# Patient Record
Sex: Female | Born: 1980 | Race: White | Hispanic: No | Marital: Married | Smoking: Never smoker
Health system: Southern US, Community
[De-identification: ages and names within clinical notes are randomized; demographics above are authoritative.]

## PROBLEM LIST (undated history)

## (undated) DIAGNOSIS — G43909 Migraine, unspecified, not intractable, without status migrainosus: Secondary | ICD-10-CM

## (undated) DIAGNOSIS — R011 Cardiac murmur, unspecified: Secondary | ICD-10-CM

## (undated) DIAGNOSIS — K8020 Calculus of gallbladder without cholecystitis without obstruction: Secondary | ICD-10-CM

## (undated) DIAGNOSIS — N200 Calculus of kidney: Secondary | ICD-10-CM

## (undated) DIAGNOSIS — T7840XA Allergy, unspecified, initial encounter: Secondary | ICD-10-CM

## (undated) DIAGNOSIS — J302 Other seasonal allergic rhinitis: Secondary | ICD-10-CM

## (undated) HISTORY — DX: Calculus of gallbladder without cholecystitis without obstruction: K80.20

## (undated) HISTORY — DX: Calculus of kidney: N20.0

## (undated) HISTORY — DX: Allergy, unspecified, initial encounter: T78.40XA

## (undated) HISTORY — PX: CHOLECYSTECTOMY: SHX55

## (undated) HISTORY — DX: Cardiac murmur, unspecified: R01.1

## (undated) HISTORY — DX: Other seasonal allergic rhinitis: J30.2

## (undated) HISTORY — DX: Migraine, unspecified, not intractable, without status migrainosus: G43.909

---

## 2000-11-12 HISTORY — PX: ECTOPIC PREGNANCY SURGERY: SHX613

## 2004-03-15 HISTORY — PX: GALLBLADDER SURGERY: SHX652

## 2004-03-15 HISTORY — PX: CHOLECYSTECTOMY: SHX55

## 2008-07-13 HISTORY — PX: TUBAL LIGATION: SHX77

## 2013-02-25 ENCOUNTER — Ambulatory Visit (INDEPENDENT_AMBULATORY_CARE_PROVIDER_SITE_OTHER): Payer: PRIVATE HEALTH INSURANCE | Admitting: *Deleted

## 2013-02-25 DIAGNOSIS — Z23 Encounter for immunization: Secondary | ICD-10-CM

## 2014-09-10 ENCOUNTER — Encounter: Payer: Self-pay | Admitting: Nurse Practitioner

## 2014-09-10 ENCOUNTER — Ambulatory Visit (INDEPENDENT_AMBULATORY_CARE_PROVIDER_SITE_OTHER): Payer: 59 | Admitting: Nurse Practitioner

## 2014-09-10 ENCOUNTER — Telehealth: Payer: Self-pay | Admitting: Nurse Practitioner

## 2014-09-10 VITALS — BP 94/66 | HR 67 | Temp 98.1°F | Ht 67.5 in | Wt 202.0 lb

## 2014-09-10 DIAGNOSIS — Z6834 Body mass index (BMI) 34.0-34.9, adult: Secondary | ICD-10-CM

## 2014-09-10 DIAGNOSIS — Z6835 Body mass index (BMI) 35.0-35.9, adult: Secondary | ICD-10-CM | POA: Insufficient documentation

## 2014-09-10 DIAGNOSIS — Z114 Encounter for screening for human immunodeficiency virus [HIV]: Secondary | ICD-10-CM

## 2014-09-10 DIAGNOSIS — Z0000 Encounter for general adult medical examination without abnormal findings: Secondary | ICD-10-CM

## 2014-09-10 DIAGNOSIS — R5383 Other fatigue: Secondary | ICD-10-CM | POA: Diagnosis not present

## 2014-09-10 DIAGNOSIS — Z6831 Body mass index (BMI) 31.0-31.9, adult: Secondary | ICD-10-CM

## 2014-09-10 DIAGNOSIS — E559 Vitamin D deficiency, unspecified: Secondary | ICD-10-CM

## 2014-09-10 LAB — LIPID PANEL
Cholesterol: 135 mg/dL (ref 0–200)
HDL: 45.5 mg/dL (ref >39.00)
LDL CALC: 77 mg/dL (ref 0–99)
NONHDL: 89.5
Total CHOL/HDL Ratio: 3
Triglycerides: 63 mg/dL (ref 0.0–149.0)
VLDL: 12.6 mg/dL (ref 0.0–40.0)

## 2014-09-10 LAB — CBC WITH DIFFERENTIAL/PLATELET
BASOS ABS: 0 10*3/uL (ref 0.0–0.1)
Basophils Relative: 0.3 % (ref 0.0–3.0)
EOS PCT: 2.1 % (ref 0.0–5.0)
Eosinophils Absolute: 0.1 10*3/uL (ref 0.0–0.7)
HCT: 42.6 % (ref 36.0–46.0)
Hemoglobin: 14.4 g/dL (ref 12.0–15.0)
LYMPHS PCT: 26.4 % (ref 12.0–46.0)
Lymphs Abs: 1.7 10*3/uL (ref 0.7–4.0)
MCHC: 33.7 g/dL (ref 30.0–36.0)
MCV: 88.1 fl (ref 78.0–100.0)
Monocytes Absolute: 0.5 10*3/uL (ref 0.1–1.0)
Monocytes Relative: 7.4 % (ref 3.0–12.0)
Neutro Abs: 4.2 10*3/uL (ref 1.4–7.7)
Neutrophils Relative %: 63.8 % (ref 43.0–77.0)
Platelets: 242 10*3/uL (ref 150.0–400.0)
RBC: 4.84 Mil/uL (ref 3.87–5.11)
RDW: 13.3 % (ref 11.5–15.5)
WBC: 6.6 10*3/uL (ref 4.0–10.5)

## 2014-09-10 LAB — COMPREHENSIVE METABOLIC PANEL
ALBUMIN: 4.2 g/dL (ref 3.5–5.2)
ALT: 11 U/L (ref 0–35)
AST: 14 U/L (ref 0–37)
Alkaline Phosphatase: 58 U/L (ref 39–117)
BUN: 8 mg/dL (ref 6–23)
CO2: 29 mEq/L (ref 19–32)
CREATININE: 0.59 mg/dL (ref 0.40–1.20)
Calcium: 9.3 mg/dL (ref 8.4–10.5)
Chloride: 104 mEq/L (ref 96–112)
GFR: 122.46 mL/min (ref >60.00)
GLUCOSE: 91 mg/dL (ref 70–99)
POTASSIUM: 4.3 meq/L (ref 3.5–5.1)
Sodium: 136 mEq/L (ref 135–145)
Total Bilirubin: 0.9 mg/dL (ref 0.2–1.2)
Total Protein: 7 g/dL (ref 6.0–8.3)

## 2014-09-10 LAB — VITAMIN D 25 HYDROXY (VIT D DEFICIENCY, FRACTURES): VITD: 12.5 ng/mL — ABNORMAL LOW (ref 30.00–100.00)

## 2014-09-10 LAB — TSH: TSH: 0.5 u[IU]/mL (ref 0.35–4.50)

## 2014-09-10 LAB — T4, FREE: Free T4: 0.83 ng/dL (ref 0.60–1.60)

## 2014-09-10 LAB — VITAMIN B12: Vitamin B-12: >1500 pg/mL — ABNORMAL HIGH (ref 211–911)

## 2014-09-10 LAB — HEMOGLOBIN A1C: Hgb A1c MFr Bld: 5 % (ref 4.6–6.5)

## 2014-09-10 MED ORDER — VITAMIN D3 1.25 MG (50000 UT) PO CAPS: 1.0000 | ORAL_CAPSULE | ORAL | Status: DC

## 2014-09-10 NOTE — Patient Instructions (Addendum)
Our office will call you with lab results and any necessary follow up.  For best health, your body mass index should be about 25.  Weight loss goal is 40  pounds. This will take about 5  months. If you want to count calories, limit to   1800   calories daily if you are exercising 1 to 3 times week &  2100  calories if exercising 5 times weekly or more. Helpful phone app for calorie counting is "Go Meals". Consider reading Eat to Live by Excell Seltzer and begin implementing principles for best human nutrition & health.  Refer to hand out for principles & suggested menu. When you diverge from the guide, get back to healthy choices with the next meal or snack.  As discussed,cut out refined sugar- anything that is sweet when you eat or drink it except fresh fruit.  Cut out refined grains- bread, rolls, biscuits, bagels, muffins, pasta and cereals that have less than 4 grams fiber per serving.  Limit animal fats & proteins to 3 to 4 times/week.  Develop lifelong habits of exercise most days of the week: take a 30 minute walk. The benefits include weight loss, lower risk for heart disease, diabetes, stroke, high blood pressure, lower rates of depression & dementia, better sleep quality & bone health.  Please return in 4 weeks to review diet & exercise changes.   Pleasure to meet you!  Preventive Care for Adults, Female A healthy lifestyle and preventive care can promote health and wellness. Preventive health guidelines for women include the following key practices.  A routine yearly physical is a good way to check with your caregiver about your health and preventive screening. It is a chance to share any concerns and updates on your health, and to receive a thorough exam.  Visit your dentist for a routine exam and preventive care every 6 months. Brush your teeth twice a day and floss once a day. Good oral hygiene prevents tooth decay and gum disease.  The frequency of eye exams is based on your  age, health, family medical history, use of contact lenses, and other factors. Follow your caregiver's recommendations for frequency of eye exams.  Eat a healthy diet. Foods like vegetables, fruits, whole grains, low-fat dairy products, and lean protein foods contain the nutrients you need without too many calories. Decrease your intake of foods high in solid fats, added sugars, and salt. Eat the right amount of calories for you.Get information about a proper diet from your caregiver, if necessary.  Regular physical exercise is one of the most important things you can do for your health. Most adults should get at least 150 minutes of moderate-intensity exercise (any activity that increases your heart rate and causes you to sweat) each week. In addition, most adults need muscle-strengthening exercises on 2 or more days a week.  Maintain a healthy weight. The body mass index (BMI) is a screening tool to identify possible weight problems. It provides an estimate of body fat based on height and weight. Your caregiver can help determine your BMI, and can help you achieve or maintain a healthy weight.For adults 20 years and older:  A BMI below 18.5 is considered underweight.  A BMI of 18.5 to 24.9 is normal.  A BMI of 25 to 29.9 is considered overweight.  A BMI of 30 and above is considered obese.  Maintain normal blood lipids and cholesterol levels by exercising and minimizing your intake of saturated fat. Eat a balanced diet  with plenty of fruit and vegetables. Blood tests for lipids and cholesterol should begin at age 14 and be repeated every 5 years. If your lipid or cholesterol levels are high, you are over 50, or you are at high risk for heart disease, you may need your cholesterol levels checked more frequently.Ongoing high lipid and cholesterol levels should be treated with medicines if diet and exercise are not effective.  If you smoke, find out from your caregiver how to quit. If you do not  use tobacco, do not start.  Lung cancer screening is recommended for adults aged 62 80 years who are at high risk for developing lung cancer because of a history of smoking. Yearly low-dose computed tomography (CT) is recommended for people who have at least a 30-pack-year history of smoking and are a current smoker or have quit within the past 15 years. A pack year of smoking is smoking an average of 1 pack of cigarettes a day for 1 year (for example: 1 pack a day for 30 years or 2 packs a day for 15 years). Yearly screening should continue until the smoker has stopped smoking for at least 15 years. Yearly screening should also be stopped for people who develop a health problem that would prevent them from having lung cancer treatment.  If you are pregnant, do not drink alcohol. If you are breastfeeding, be very cautious about drinking alcohol. If you are not pregnant and choose to drink alcohol, do not exceed 1 drink per day. One drink is considered to be 12 ounces (355 mL) of beer, 5 ounces (148 mL) of wine, or 1.5 ounces (44 mL) of liquor.  Avoid use of street drugs. Do not share needles with anyone. Ask for help if you need support or instructions about stopping the use of drugs.  High blood pressure causes heart disease and increases the risk of stroke. Your blood pressure should be checked at least every 1 to 2 years. Ongoing high blood pressure should be treated with medicines if weight loss and exercise are not effective.  If you are 62 to 36 years old, ask your caregiver if you should take aspirin to prevent strokes.  Diabetes screening involves taking a blood sample to check your fasting blood sugar level. This should be done once every 3 years, after age 39, if you are within normal weight and without risk factors for diabetes. Testing should be considered at a younger age or be carried out more frequently if you are overweight and have at least 1 risk factor for diabetes.  Breast cancer  screening is essential preventive care for women. You should practice "breast self-awareness." This means understanding the normal appearance and feel of your breasts and may include breast self-examination. Any changes detected, no matter how small, should be reported to a caregiver. Women in their 54s and 30s should have a clinical breast exam (CBE) by a caregiver as part of a regular health exam every 1 to 3 years. After age 44, women should have a CBE every year. Starting at age 60, women should consider having a mammography (breast X-ray test) every year. Women who have a family history of breast cancer should talk to their caregiver about genetic screening. Women at a high risk of breast cancer should talk to their caregivers about having magnetic resonance imaging (MRI) and a mammography every year.  Breast cancer gene (BRCA)-related cancer risk assessment is recommended for women who have family members with BRCA-related cancers. BRCA-related cancers include  breast, ovarian, tubal, and peritoneal cancers. Having family members with these cancers may be associated with an increased risk for harmful changes (mutations) in the breast cancer genes BRCA1 and BRCA2. Results of the assessment will determine the need for genetic counseling and BRCA1 and BRCA2 testing.  The Pap test is a screening test for cervical cancer. A Pap test can show cell changes on the cervix that might become cervical cancer if left untreated. A Pap test is a procedure in which cells are obtained and examined from the lower end of the uterus (cervix).  Women should have a Pap test starting at age 64.  Between ages 77 and 72, Pap tests should be repeated every 2 years.  Beginning at age 50, you should have a Pap test every 3 years as long as the past 3 Pap tests have been normal.  Some women have medical problems that increase the chance of getting cervical cancer. Talk to your caregiver about these problems. It is especially  important to talk to your caregiver if a new problem develops soon after your last Pap test. In these cases, your caregiver may recommend more frequent screening and Pap tests.  The above recommendations are the same for women who have or have not gotten the vaccine for human papillomavirus (HPV).  If you had a hysterectomy for a problem that was not cancer or a condition that could lead to cancer, then you no longer need Pap tests. Even if you no longer need a Pap test, a regular exam is a good idea to make sure no other problems are starting.  If you are between ages 59 and 39, and you have had normal Pap tests going back 10 years, you no longer need Pap tests. Even if you no longer need a Pap test, a regular exam is a good idea to make sure no other problems are starting.  If you have had past treatment for cervical cancer or a condition that could lead to cancer, you need Pap tests and screening for cancer for at least 20 years after your treatment.  If Pap tests have been discontinued, risk factors (such as a new sexual partner) need to be reassessed to determine if screening should be resumed.  The HPV test is an additional test that may be used for cervical cancer screening. The HPV test looks for the virus that can cause the cell changes on the cervix. The cells collected during the Pap test can be tested for HPV. The HPV test could be used to screen women aged 7 years and older, and should be used in women of any age who have unclear Pap test results. After the age of 91, women should have HPV testing at the same frequency as a Pap test.  Colorectal cancer can be detected and often prevented. Most routine colorectal cancer screening begins at the age of 95 and continues through age 60. However, your caregiver may recommend screening at an earlier age if you have risk factors for colon cancer. On a yearly basis, your caregiver may provide home test kits to check for hidden blood in the stool.  Use of a small camera at the end of a tube, to directly examine the colon (sigmoidoscopy or colonoscopy), can detect the earliest forms of colorectal cancer. Talk to your caregiver about this at age 64, when routine screening begins. Direct examination of the colon should be repeated every 5 to 10 years through age 36, unless early forms of pre-cancerous  polyps or small growths are found.  Hepatitis C blood testing is recommended for all people born from [birth year] through 1965 and any individual with known risks for hepatitis C.  Practice safe sex. Use condoms and avoid high-risk sexual practices to reduce the spread of sexually transmitted infections (STIs). STIs include gonorrhea, chlamydia, syphilis, trichomonas, herpes, HPV, and human immunodeficiency virus (HIV). Herpes, HIV, and HPV are viral illnesses that have no cure. They can result in disability, cancer, and death. Sexually active women aged 63 and younger should be checked for chlamydia. Older women with new or multiple partners should also be tested for chlamydia. Testing for other STIs is recommended if you are sexually active and at increased risk.  Osteoporosis is a disease in which the bones lose minerals and strength with aging. This can result in serious bone fractures. The risk of osteoporosis can be identified using a bone density scan. Women ages 43 and over and women at risk for fractures or osteoporosis should discuss screening with their caregivers. Ask your caregiver whether you should take a calcium supplement or vitamin D to reduce the rate of osteoporosis.  Menopause can be associated with physical symptoms and risks. Hormone replacement therapy is available to decrease symptoms and risks. You should talk to your caregiver about whether hormone replacement therapy is right for you.  Use sunscreen. Apply sunscreen liberally and repeatedly throughout the day. You should seek shade when your shadow is shorter than you. Protect  yourself by wearing long sleeves, pants, a wide-brimmed hat, and sunglasses year round, whenever you are outdoors.  Once a month, do a whole body skin exam, using a mirror to look at the skin on your back. Notify your caregiver of new moles, moles that have irregular borders, moles that are larger than a pencil eraser, or moles that have changed in shape or color.  Stay current with required immunizations.  Influenza vaccine. All adults should be immunized every year.  Tetanus, diphtheria, and acellular pertussis (Td, Tdap) vaccine. Pregnant women should receive 1 dose of Tdap vaccine during each pregnancy. The dose should be obtained regardless of the length of time since the last dose. Immunization is preferred during the 27th to 36th week of gestation. An adult who has not previously received Tdap or who does not know her vaccine status should receive 1 dose of Tdap. This initial dose should be followed by tetanus and diphtheria toxoids (Td) booster doses every 10 years. Adults with an unknown or incomplete history of completing a 3-dose immunization series with Td-containing vaccines should begin or complete a primary immunization series including a Tdap dose. Adults should receive a Td booster every 10 years.  Varicella vaccine. An adult without evidence of immunity to varicella should receive 2 doses or a second dose if she has previously received 1 dose. Pregnant females who do not have evidence of immunity should receive the first dose after pregnancy. This first dose should be obtained before leaving the health care facility. The second dose should be obtained 4 8 weeks after the first dose.  Human papillomavirus (HPV) vaccine. Females aged 62 26 years who have not received the vaccine previously should obtain the 3-dose series. The vaccine is not recommended for use in pregnant females. However, pregnancy testing is not needed before receiving a dose. If a female is found to be pregnant after  receiving a dose, no treatment is needed. In that case, the remaining doses should be delayed until after the pregnancy. Immunization is recommended  for any person with an immunocompromised condition through the age of 72 years if she did not get any or all doses earlier. During the 3-dose series, the second dose should be obtained 4 8 weeks after the first dose. The third dose should be obtained 24 weeks after the first dose and 16 weeks after the second dose.  Zoster vaccine. One dose is recommended for adults aged 21 years or older unless certain conditions are present.  Measles, mumps, and rubella (MMR) vaccine. Adults born before [birth year] generally are considered immune to measles and mumps. Adults born in [birth year] or later should have 1 or more doses of MMR vaccine unless there is a contraindication to the vaccine or there is laboratory evidence of immunity to each of the three diseases. A routine second dose of MMR vaccine should be obtained at least 28 days after the first dose for students attending postsecondary schools, health care workers, or international travelers. People who received inactivated measles vaccine or an unknown type of measles vaccine during 1963 1967 should receive 2 doses of MMR vaccine. People who received inactivated mumps vaccine or an unknown type of mumps vaccine before 1979 and are at high risk for mumps infection should consider immunization with 2 doses of MMR vaccine. For females of childbearing age, rubella immunity should be determined. If there is no evidence of immunity, females who are not pregnant should be vaccinated. If there is no evidence of immunity, females who are pregnant should delay immunization until after pregnancy. Unvaccinated health care workers born before 3 who lack laboratory evidence of measles, mumps, or rubella immunity or laboratory confirmation of disease should consider measles and mumps immunization with 2 doses of MMR vaccine or rubella  immunization with 1 dose of MMR vaccine.  Pneumococcal 13-valent conjugate (PCV13) vaccine. When indicated, a person who is uncertain of her immunization history and has no record of immunization should receive the PCV13 vaccine. An adult aged 32 years or older who has certain medical conditions and has not been previously immunized should receive 1 dose of PCV13 vaccine. This PCV13 should be followed with a dose of pneumococcal polysaccharide (PPSV23) vaccine. The PPSV23 vaccine dose should be obtained at least 8 weeks after the dose of PCV13 vaccine. An adult aged 22 years or older who has certain medical conditions and previously received 1 or more doses of PPSV23 vaccine should receive 1 dose of PCV13. The PCV13 vaccine dose should be obtained 1 or more years after the last PPSV23 vaccine dose.  Pneumococcal polysaccharide (PPSV23) vaccine. When PCV13 is also indicated, PCV13 should be obtained first. All adults aged 31 years and older should be immunized. An adult younger than age 70 years who has certain medical conditions should be immunized. Any person who resides in a nursing home or long-term care facility should be immunized. An adult smoker should be immunized. People with an immunocompromised condition and certain other conditions should receive both PCV13 and PPSV23 vaccines. People with human immunodeficiency virus (HIV) infection should be immunized as soon as possible after diagnosis. Immunization during chemotherapy or radiation therapy should be avoided. Routine use of PPSV23 vaccine is not recommended for American Indians, [place] Natives, or people younger than 65 years unless there are medical conditions that require PPSV23 vaccine. When indicated, people who have unknown immunization and have no record of immunization should receive PPSV23 vaccine. One-time revaccination 5 years after the first dose of PPSV23 is recommended for people aged 32 64 years who have chronic kidney  failure,  nephrotic syndrome, asplenia, or immunocompromised conditions. People who received 1 2 doses of PPSV23 before age 79 years should receive another dose of PPSV23 vaccine at age 57 years or later if at least 5 years have passed since the previous dose. Doses of PPSV23 are not needed for people immunized with PPSV23 at or after age 51 years.  Meningococcal vaccine. Adults with asplenia or persistent complement component deficiencies should receive 2 doses of quadrivalent meningococcal conjugate (MenACWY-D) vaccine. The doses should be obtained at least 2 months apart. Microbiologists working with certain meningococcal bacteria, [place] recruits, people at risk during an outbreak, and people who travel to or live in countries with a high rate of meningitis should be immunized. A first-year college student up through age 97 years who is living in a residence hall should receive a dose if she did not receive a dose on or after her 16th birthday. Adults who have certain high-risk conditions should receive one or more doses of vaccine.  Hepatitis A vaccine. Adults who wish to be protected from this disease, have certain high-risk conditions, work with hepatitis A-infected animals, work in hepatitis A research labs, or travel to or work in countries with a high rate of hepatitis A should be immunized. Adults who were previously unvaccinated and who anticipate close contact with an international adoptee during the first 60 days after arrival in the [place] from a country with a high rate of hepatitis A should be immunized.  Hepatitis B vaccine. Adults who wish to be protected from this disease, have certain high-risk conditions, may be exposed to blood or other infectious body fluids, are household contacts or sex partners of hepatitis B positive people, are clients or workers in certain care facilities, or travel to or work in countries with a high rate of hepatitis B should be immunized.  Haemophilus  influenzae type b (Hib) vaccine. A previously unvaccinated person with asplenia or sickle cell disease or having a scheduled splenectomy should receive 1 dose of Hib vaccine. Regardless of previous immunization, a recipient of a hematopoietic stem cell transplant should receive a 3-dose series 6 12 months after her successful transplant. Hib vaccine is not recommended for adults with HIV infection. Preventive Services / Frequency Ages 90 to 77  Blood pressure check.** / Every 1 to 2 years.  Lipid and cholesterol check.** / Every 5 years beginning at age 49.  Clinical breast exam.** / Every 3 years for women in their 17s and 41s.  BRCA-related cancer risk assessment.** / For women who have family members with a BRCA-related cancer (breast, ovarian, tubal, or peritoneal cancers).  Pap test.** / Every 2 years from ages 52 through 58. Every 3 years starting at age 60 through age 62 or 76 with a history of 3 consecutive normal Pap tests.  HPV screening.** / Every 3 years from ages 42 through ages 41 to 38 with a history of 3 consecutive normal Pap tests.  Hepatitis C blood test.** / For any individual with known risks for hepatitis C.  Skin self-exam. / Monthly.  Influenza vaccine. / Every year.  Tetanus, diphtheria, and acellular pertussis (Tdap, Td) vaccine.** / Consult your caregiver. Pregnant women should receive 1 dose of Tdap vaccine during each pregnancy. 1 dose of Td every 10 years.  Varicella vaccine.** / Consult your caregiver. Pregnant females who do not have evidence of immunity should receive the first dose after pregnancy.  HPV vaccine. / 3 doses over 6 months, if 19 and younger.  The vaccine is not recommended for use in pregnant females. However, pregnancy testing is not needed before receiving a dose.  Measles, mumps, rubella (MMR) vaccine.** / You need at least 1 dose of MMR if you were born in 1957 or later. You may also need a 2nd dose. For females of childbearing age,  rubella immunity should be determined. If there is no evidence of immunity, females who are not pregnant should be vaccinated. If there is no evidence of immunity, females who are pregnant should delay immunization until after pregnancy.  Pneumococcal 13-valent conjugate (PCV13) vaccine.** / Consult your caregiver.  Pneumococcal polysaccharide (PPSV23) vaccine.** / 1 to 2 doses if you smoke cigarettes or if you have certain conditions.  Meningococcal vaccine.** / 1 dose if you are age 60 to 81 years and a Market researcher living in a residence hall, or have one of several medical conditions, you need to get vaccinated against meningococcal disease. You may also need additional booster doses.  Hepatitis A vaccine.** / Consult your caregiver.  Hepatitis B vaccine.** / Consult your caregiver.  Haemophilus influenzae type b (Hib) vaccine.** / Consult your caregiver. Ages 64 to 56  Blood pressure check.** / Every 1 to 2 years.  Lipid and cholesterol check.** / Every 5 years beginning at age 65.  Lung cancer screening. / Every year if you are aged 34 80 years and have a 30-pack-year history of smoking and currently smoke or have quit within the past 15 years. Yearly screening is stopped once you have quit smoking for at least 15 years or develop a health problem that would prevent you from having lung cancer treatment.  Clinical breast exam.** / Every year after age 49.  BRCA-related cancer risk assessment.** / For women who have family members with a BRCA-related cancer (breast, ovarian, tubal, or peritoneal cancers).  Mammogram.** / Every year beginning at age 65 and continuing for as long as you are in good health. Consult with your caregiver.  Pap test.** / Every 3 years starting at age 18 through age 56 or 91 with a history of 3 consecutive normal Pap tests.  HPV screening.** / Every 3 years from ages 64 through ages 55 to 2 with a history of 3 consecutive normal Pap  tests.  Fecal occult blood test (FOBT) of stool. / Every year beginning at age 54 and continuing until age 47. You may not need to do this test if you get a colonoscopy every 10 years.  Flexible sigmoidoscopy or colonoscopy.** / Every 5 years for a flexible sigmoidoscopy or every 10 years for a colonoscopy beginning at age 59 and continuing until age 30.  Hepatitis C blood test.** / For all people born from 18 through 1967 and any individual with known risks for hepatitis C.  Skin self-exam. / Monthly.  Influenza vaccine. / Every year.  Tetanus, diphtheria, and acellular pertussis (Tdap/Td) vaccine.** / Consult your caregiver. Pregnant women should receive 1 dose of Tdap vaccine during each pregnancy. 1 dose of Td every 10 years.  Varicella vaccine.** / Consult your caregiver. Pregnant females who do not have evidence of immunity should receive the first dose after pregnancy.  Zoster vaccine.** / 1 dose for adults aged 76 years or older.  Measles, mumps, rubella (MMR) vaccine.** / You need at least 1 dose of MMR if you were born in 1957 or later. You may also need a 2nd dose. For females of childbearing age, rubella immunity should be determined. If there is no evidence  of immunity, females who are not pregnant should be vaccinated. If there is no evidence of immunity, females who are pregnant should delay immunization until after pregnancy.  Pneumococcal 13-valent conjugate (PCV13) vaccine.** / Consult your caregiver.  Pneumococcal polysaccharide (PPSV23) vaccine.** / 1 to 2 doses if you smoke cigarettes or if you have certain conditions.  Meningococcal vaccine.** / Consult your caregiver.  Hepatitis A vaccine.** / Consult your caregiver.  Hepatitis B vaccine.** / Consult your caregiver.  Haemophilus influenzae type b (Hib) vaccine.** / Consult your caregiver. Ages 45 and over  Blood pressure check.** / Every 1 to 2 years.  Lipid and cholesterol check.** / Every 5 years  beginning at age 58.  Lung cancer screening. / Every year if you are aged 39 80 years and have a 30-pack-year history of smoking and currently smoke or have quit within the past 15 years. Yearly screening is stopped once you have quit smoking for at least 15 years or develop a health problem that would prevent you from having lung cancer treatment.  Clinical breast exam.** / Every year after age 53.  BRCA-related cancer risk assessment.** / For women who have family members with a BRCA-related cancer (breast, ovarian, tubal, or peritoneal cancers).  Mammogram.** / Every year beginning at age 73 and continuing for as long as you are in good health. Consult with your caregiver.  Pap test.** / Every 3 years starting at age 66 through age 27 or 83 with a 3 consecutive normal Pap tests. Testing can be stopped between 65 and 70 with 3 consecutive normal Pap tests and no abnormal Pap or HPV tests in the past 10 years.  HPV screening.** / Every 3 years from ages 9 through ages 55 or 4 with a history of 3 consecutive normal Pap tests. Testing can be stopped between 65 and 70 with 3 consecutive normal Pap tests and no abnormal Pap or HPV tests in the past 10 years.  Fecal occult blood test (FOBT) of stool. / Every year beginning at age 60 and continuing until age 28. You may not need to do this test if you get a colonoscopy every 10 years.  Flexible sigmoidoscopy or colonoscopy.** / Every 5 years for a flexible sigmoidoscopy or every 10 years for a colonoscopy beginning at age 50 and continuing until age 70.  Hepatitis C blood test.** / For all people born from [birth year] through 1967 and any individual with known risks for hepatitis C.  Osteoporosis screening.** / A one-time screening for women ages 59 and over and women at risk for fractures or osteoporosis.  Skin self-exam. / Monthly.  Influenza vaccine. / Every year.  Tetanus, diphtheria, and acellular pertussis (Tdap/Td) vaccine.** / 1 dose of Td  every 10 years.  Varicella vaccine.** / Consult your caregiver.  Zoster vaccine.** / 1 dose for adults aged 67 years or older.  Pneumococcal 13-valent conjugate (PCV13) vaccine.** / Consult your caregiver.  Pneumococcal polysaccharide (PPSV23) vaccine.** / 1 dose for all adults aged 84 years and older.  Meningococcal vaccine.** / Consult your caregiver.  Hepatitis A vaccine.** / Consult your caregiver.  Hepatitis B vaccine.** / Consult your caregiver.  Haemophilus influenzae type b (Hib) vaccine.** / Consult your caregiver. ** Family history and personal history of risk and conditions may change your caregiver's recommendations. Document Released: 06/27/2001 Document Revised: 08/26/2012 Document Reviewed: 09/26/2010 [facility] Patient Information 2014 [place], [place].

## 2014-09-10 NOTE — Telephone Encounter (Signed)
pls call pt: Advise Labs look great! Vit d is very low-12. Start prescription.  I will see her in 1 mo.

## 2014-09-10 NOTE — Progress Notes (Signed)
Subjective:     Molly Henson is a 34 y.o. female and is here for a comprehensive physical exam. The patient reports problems - seasonal allergies, desires to lose weight, fatigue. Wt loss: weighed 157 6 yrs ago when went through divorce-due to stress. Weighed 175 when graduated HS. Struggles with "sweet cravings"-eats refined sugar & drinks soda everyday. Not exercising. Discussed health risks associated w/obesity. Discussed benefits of lifestyle changes. Fatigue: several mos. No associated symptoms. Started B12 1 mo ago. Taking 3000 mcg qd. No improvement. Likely r/t high sugar diet & no exercise.  History   Social History  . Marital Status: Married    Spouse Name: N/A  . Number of Children: 2  . Years of Education: N/A   Occupational History  . LPN [employer]   Social History Main Topics  . Smoking status: Never Smoker   . Smokeless tobacco: Not on file  . Alcohol Use: No  . Drug Use: No  . Sexual Activity: Yes    Birth Control/ Protection: Surgical   Other Topics Concern  . Not on file   Social History Narrative   Molly Henson is divorced & remarried. She lives with her husband, 2 biological children & step-son.   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   Health Maintenance  Topic Date Due  . INFLUENZA VACCINE  12/14/2014  . PAP SMEAR  06/15/2017  . TETANUS/TDAP  08/10/2018  . HIV Screening  Completed    The following portions of the patient's history were reviewed and updated as appropriate: allergies, current medications, past family history, past medical history, past social history, past surgical history and problem list.  Review of Systems Constitutional: negative for fevers Eyes: positive for contacts/glasses and due for eye exam Ears, nose, mouth, throat, and face: negative for nasal congestion and sore throat Respiratory: negative for cough Cardiovascular: positive for occasional palpitation-lasts few seconds, no associated symptoms.  Occasional LE edema., negative for chest pressure/discomfort Gastrointestinal: positive for constipation, negative for abdominal pain, diarrhea and dyspepsia Genitourinary:negative for abnormal menstrual periods Neurological: positive for occasional migraine, started in HS. Decreased frequency since started wearing galsses Allergic/Immunologic: positive for seasonal allergies-watery eyes, nasal congestion. Not bad enough to take meds.   Objective:    BP 94/66 mmHg  Pulse 67  Temp(Src) 98.1 F (36.7 C) (Oral)  Ht 5' 7.5" (1.715 m)  Wt 202 lb (91.627 kg)  BMI 31.15 kg/m2  SpO2 100%  LMP 09/02/2014 General appearance: alert, cooperative, appears stated age and no distress Head: Normocephalic, without obvious abnormality, atraumatic Eyes: negative findings: lids and lashes normal, conjunctivae and sclerae normal, corneas clear and pupils equal, round, reactive to light and accomodation Ears: normal TM's and external ear canals both ears Throat: lips, mucosa, and tongue normal; teeth and gums normal and missing 1 R upper tooth Neck: no adenopathy, no carotid bruit, supple, symmetrical, trachea midline and thyroid not enlarged, symmetric, no tenderness/mass/nodules Lungs: clear to auscultation bilaterally Heart: regular rate and rhythm, S1, S2 normal, no murmur, click, rub or gallop Abdomen: soft, non-tender; bowel sounds normal; no masses,  no organomegaly Extremities: extremities normal, atraumatic, no cyanosis or edema Pulses: 2+ and symmetric Neurologic: Grossly normal    Assessment:Plan   1. Screening for HIV (human immunodeficiency virus) - HIV antibody  2. Preventative health care [facility] in [place]. rec diet & exercise changes for best health - CBC with Differential/Platelet - Comprehensive metabolic panel - Hemoglobin A1c - Lipid panel -  T4, free - TSH - Vit D  25 hydroxy (rtn osteoporosis monitoring)  3. BMI 31.0-31.9,adult Wt loss goal 40 lb over 5  mos Reviewed specific diet & exercise changes, gave HO   4 fatigue Diet changes & exercise -b12 F/u 4 weeks-wt management, discuss labs

## 2014-09-10 NOTE — Progress Notes (Signed)
Pre visit review using our clinic review tool, if applicable. No additional management support is needed unless otherwise documented below in the visit note. 

## 2014-09-11 LAB — HIV ANTIBODY (ROUTINE TESTING W REFLEX): HIV 1&2 Ab, 4th Generation: NONREACTIVE

## 2014-09-11 NOTE — Telephone Encounter (Signed)
LMOVM-identified about lab results. Patient to call back with any questions or concerns. DPR Signed.  

## 2015-02-03 DIAGNOSIS — R05 Cough: Secondary | ICD-10-CM | POA: Diagnosis not present

## 2015-02-09 ENCOUNTER — Ambulatory Visit (INDEPENDENT_AMBULATORY_CARE_PROVIDER_SITE_OTHER): Payer: 59 | Admitting: Family Medicine

## 2015-02-09 ENCOUNTER — Encounter: Payer: Self-pay | Admitting: Family Medicine

## 2015-02-09 VITALS — BP 110/75 | HR 70 | Temp 97.2°F | Ht 67.5 in | Wt 202.0 lb

## 2015-02-09 DIAGNOSIS — R05 Cough: Secondary | ICD-10-CM

## 2015-02-09 DIAGNOSIS — J189 Pneumonia, unspecified organism: Secondary | ICD-10-CM

## 2015-02-09 DIAGNOSIS — R059 Cough, unspecified: Secondary | ICD-10-CM

## 2015-02-09 MED ORDER — GUAIFENESIN-CODEINE 100-10 MG/5ML PO SYRP: 5.0000 mL | ORAL_SOLUTION | Freq: Three times a day (TID) | ORAL | Status: DC | PRN

## 2015-02-09 MED ORDER — BENZONATATE 100 MG PO CAPS: 100.0000 mg | ORAL_CAPSULE | Freq: Three times a day (TID) | ORAL | Status: DC | PRN

## 2015-02-09 MED ORDER — LEVOFLOXACIN 500 MG PO TABS: 500.0000 mg | ORAL_TABLET | Freq: Every day | ORAL | Status: DC

## 2015-02-09 NOTE — Progress Notes (Signed)
   HPI  Patient presents today for concern for pneumonia  Patient explains that she's been ill for 2 weeks with cough, mild dyspnea, and malaise.  She was started azithromycin 3 days ago that has gotten steadily worse over that time. She states that her symptoms are the most severe today with severe malaise.  PMH: Smoking status noted ROS: Per HPI  Objective: BP 110/75 mmHg  Pulse 70  Temp(Src) 97.2 F (36.2 C) (Oral)  Ht 5' 7.5" (1.715 m)  Wt 202 lb (91.627 kg)  BMI 31.15 kg/m2 Gen: NAD, alert, cooperative with exam HEENT: NCAT,  oropharynx clear, nares clear CV: RRR, good S1/S2, no murmur Resp:  Right lower lung fields with wheezing and some crackles, nonlabored Abd: SNTND, BS present, no guarding or organomegaly Ext: No edema, warm Neuro: Alert and oriented, No gross deficits  CXR 02/09/2015- Streaky infiltrate in RLL  Assessment and plan:  # Community-acquired pneumonia Change azithromycin to Levaquin with worsening symptoms CXR with streaky infiltrates in RLL Also given tussinex for cough and discomfort, limiting use to nighttime given likely sedation Tessalon perles   Orders Placed This Encounter  Procedures  . DG Chest 2 View    Standing Status: Future     Number of Occurrences:      Standing Expiration Date: 04/10/2016    Order Specific Question:  Reason for Exam (SYMPTOM  OR DIAGNOSIS REQUIRED)    Answer:  cough    Order Specific Question:  Is the patient pregnant?    Answer:  No    Order Specific Question:  Preferred imaging location?    Answer:  Internal    Meds ordered this encounter  Medications  . levofloxacin (LEVAQUIN) 500 MG tablet    Sig: Take 1 tablet (500 mg total) by mouth daily.    Dispense:  7 tablet    Refill:  0  . guaiFENesin-codeine (ROBITUSSIN AC) 100-10 MG/5ML syrup    Sig: Take 5 mLs by mouth 3 (three) times daily as needed for cough.    Dispense:  120 mL    Refill:  0  . benzonatate (TESSALON PERLES) 100 MG capsule    Sig:  Take 1 capsule (100 mg total) by mouth 3 (three) times daily as needed for cough.    Dispense:  30 capsule    Refill:  [place], MD [facility] 02/09/2015, 1:07 PM

## 2015-03-04 ENCOUNTER — Other Ambulatory Visit: Payer: Self-pay | Admitting: Nurse Practitioner

## 2015-03-04 ENCOUNTER — Other Ambulatory Visit (INDEPENDENT_AMBULATORY_CARE_PROVIDER_SITE_OTHER): Payer: 59

## 2015-03-04 DIAGNOSIS — J181 Lobar pneumonia, unspecified organism: Principal | ICD-10-CM

## 2015-03-04 DIAGNOSIS — J189 Pneumonia, unspecified organism: Secondary | ICD-10-CM | POA: Diagnosis not present

## 2015-04-06 ENCOUNTER — Other Ambulatory Visit: Payer: Self-pay | Admitting: Nurse Practitioner

## 2015-04-06 ENCOUNTER — Other Ambulatory Visit: Payer: Self-pay | Admitting: *Deleted

## 2015-04-06 ENCOUNTER — Ambulatory Visit (INDEPENDENT_AMBULATORY_CARE_PROVIDER_SITE_OTHER): Payer: 59

## 2015-04-06 DIAGNOSIS — R0989 Other specified symptoms and signs involving the circulatory and respiratory systems: Secondary | ICD-10-CM

## 2015-10-21 ENCOUNTER — Encounter: Payer: Self-pay | Admitting: Nurse Practitioner

## 2015-10-21 ENCOUNTER — Ambulatory Visit (INDEPENDENT_AMBULATORY_CARE_PROVIDER_SITE_OTHER): Payer: 59 | Admitting: Nurse Practitioner

## 2015-10-21 VITALS — BP 95/62 | HR 70 | Temp 97.4°F | Ht 67.5 in | Wt 209.0 lb

## 2015-10-21 DIAGNOSIS — J020 Streptococcal pharyngitis: Secondary | ICD-10-CM | POA: Diagnosis not present

## 2015-10-21 DIAGNOSIS — J029 Acute pharyngitis, unspecified: Secondary | ICD-10-CM | POA: Diagnosis not present

## 2015-10-21 LAB — RAPID STREP SCREEN (MED CTR MEBANE ONLY): Strep Gp A Ag, IA W/Reflex: POSITIVE — AB

## 2015-10-21 MED ORDER — AMOXICILLIN 875 MG PO TABS: 875.0000 mg | ORAL_TABLET | Freq: Two times a day (BID) | ORAL | Status: DC

## 2015-10-21 NOTE — Patient Instructions (Signed)

## 2015-10-21 NOTE — Progress Notes (Signed)
  Subjective:     Molly Henson is a 35 y.o. female who presents for evaluation of sore throat. Associated symptoms include sore throat. Onset of symptoms was 4 days ago, and have been unchanged since that time. She is drinking plenty of fluids. She has not had a recent close exposure to someone with proven streptococcal pharyngitis.  The following portions of the patient's history were reviewed and updated as appropriate: allergies, current medications, past family history, past medical history, past social history, past surgical history and problem list.  Review of Systems Pertinent items are noted in HPI.    Objective:    BP 95/62 mmHg  Pulse 70  Temp(Src) 97.4 F (36.3 C) (Oral)  Ht 5' 7.5" (1.715 m)  Wt 209 lb (94.802 kg)  BMI 32.23 kg/m2 Head: Normocephalic, without obvious abnormality, atraumatic Eyes: conjunctivae/corneas clear. PERRL, EOM's intact. Fundi benign. Ears: normal TM's and external ear canals both ears Nose: Nares normal. Septum midline. Mucosa normal. No drainage or sinus tenderness. Throat: abnormal findings: marked oropharyngeal erythema Neck: no adenopathy, no carotid bruit, no JVD, supple, symmetrical, trachea midline and thyroid not enlarged, symmetric, no tenderness/mass/nodules Lungs: clear to auscultation bilaterally Heart: regular rate and rhythm, S1, S2 normal, no murmur, click, rub or gallop  Laboratory Strep test done. Results:positive.    Assessment:    Acute pharyngitis, likely.    Plan:  Take medication as prescribe Cotton underwear Take shower not bath Cranberry juice, yogurt Force fluids AZO over the counter X2 days Culture pending RTO prn  Mary-Margaret Hassell Done, FNP

## 2015-10-26 ENCOUNTER — Other Ambulatory Visit: Payer: Self-pay | Admitting: *Deleted

## 2015-10-26 MED ORDER — FLUCONAZOLE 150 MG PO TABS: 150.0000 mg | ORAL_TABLET | Freq: Once | ORAL | Status: DC

## 2016-01-25 IMAGING — CR DG CHEST 2V
2 series · 2 of 2 positions shown · non-contrast
Comparison: None.

CLINICAL DATA: Pneumonia

EXAM:
CHEST  2 VIEW

[view not recorded (1 of 2)]
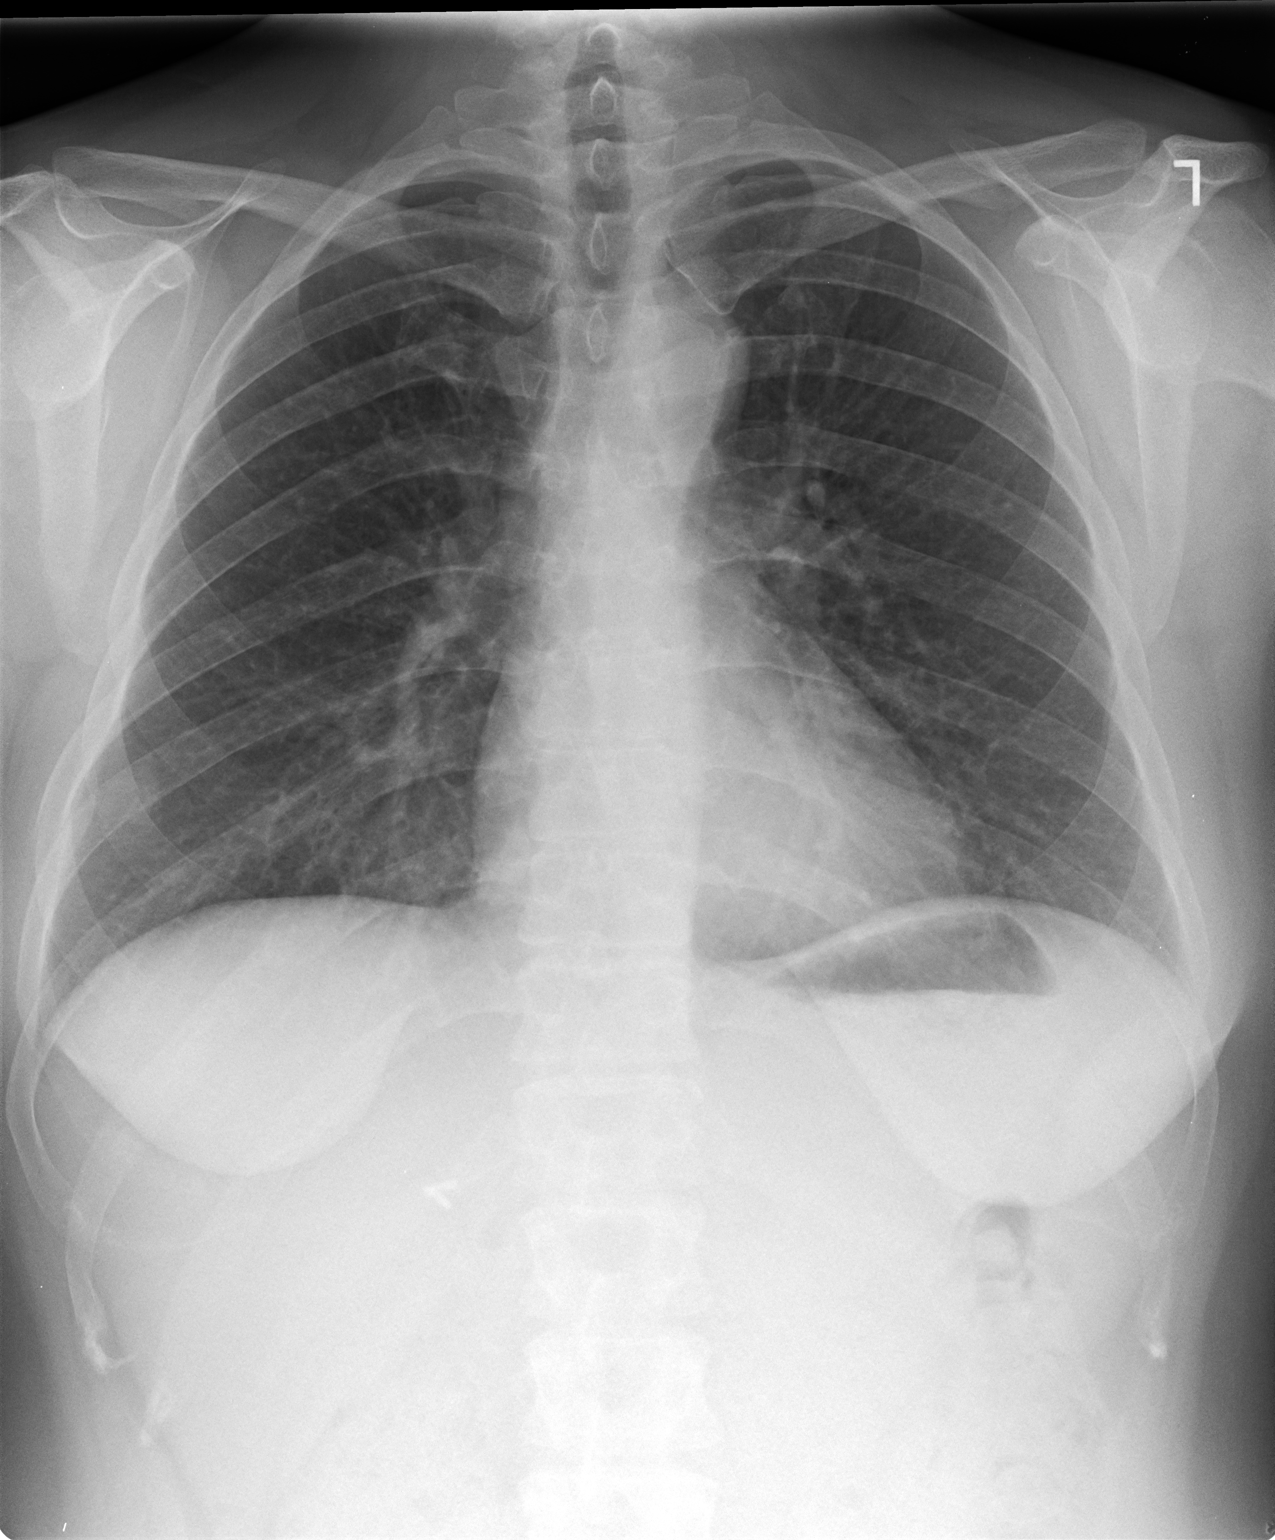

[view not recorded (2 of 2)]
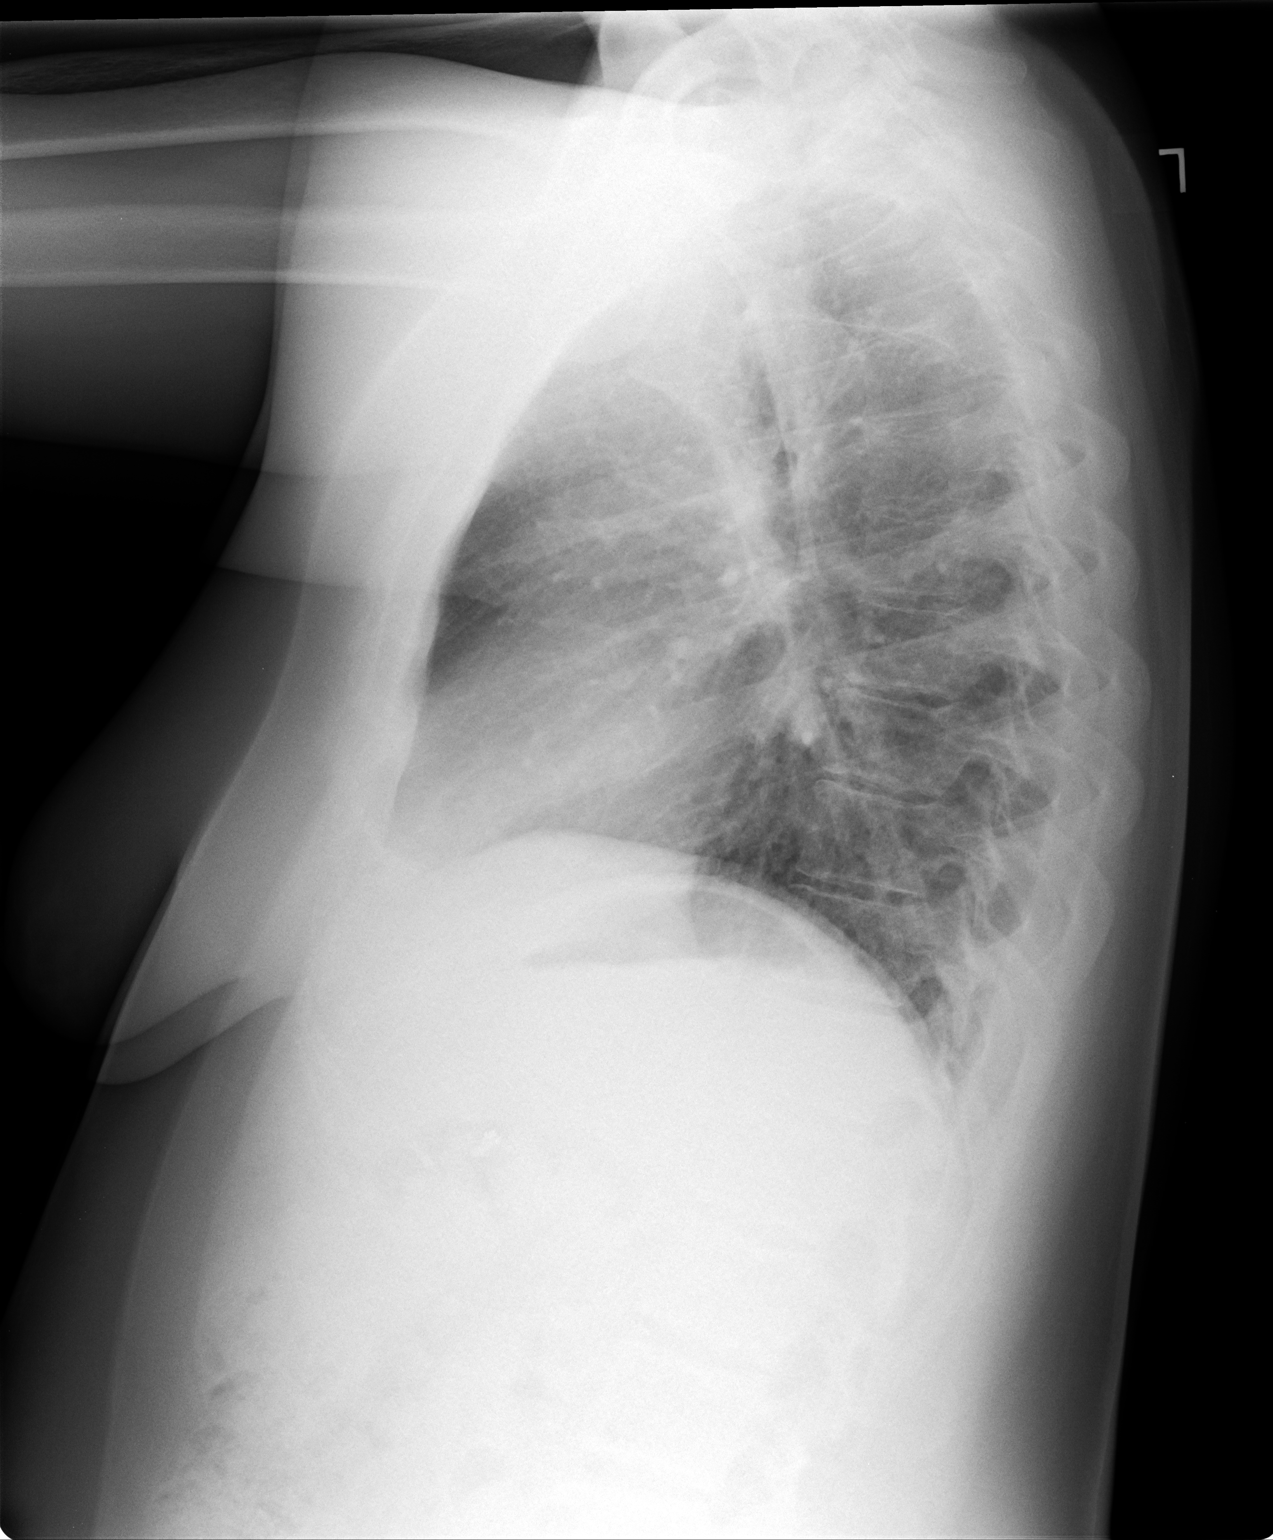

[2 of 2 positions shown; findings below may reference images not displayed]

FINDINGS: Normal heart size. Clear lungs. No pneumothorax or pleural effusion.
IMPRESSION: No active cardiopulmonary disease.

## 2016-03-28 ENCOUNTER — Other Ambulatory Visit: Payer: Self-pay | Admitting: Nurse Practitioner

## 2016-03-28 MED ORDER — NITROFURANTOIN MONOHYD MACRO 100 MG PO CAPS: 100.0000 mg | ORAL_CAPSULE | Freq: Two times a day (BID) | ORAL | 0 refills | Status: DC

## 2016-04-19 DIAGNOSIS — Z01419 Encounter for gynecological examination (general) (routine) without abnormal findings: Secondary | ICD-10-CM | POA: Diagnosis not present

## 2016-04-19 DIAGNOSIS — H5203 Hypermetropia, bilateral: Secondary | ICD-10-CM | POA: Diagnosis not present

## 2016-04-19 DIAGNOSIS — H52223 Regular astigmatism, bilateral: Secondary | ICD-10-CM | POA: Diagnosis not present

## 2016-04-19 DIAGNOSIS — Z6833 Body mass index (BMI) 33.0-33.9, adult: Secondary | ICD-10-CM | POA: Diagnosis not present

## 2016-12-12 ENCOUNTER — Encounter: Payer: Self-pay | Admitting: Family Medicine

## 2016-12-12 ENCOUNTER — Ambulatory Visit (INDEPENDENT_AMBULATORY_CARE_PROVIDER_SITE_OTHER): Payer: 59

## 2016-12-12 ENCOUNTER — Ambulatory Visit (INDEPENDENT_AMBULATORY_CARE_PROVIDER_SITE_OTHER): Payer: 59 | Admitting: Family Medicine

## 2016-12-12 ENCOUNTER — Other Ambulatory Visit: Payer: Self-pay | Admitting: Family Medicine

## 2016-12-12 VITALS — BP 122/71 | HR 75 | Temp 97.6°F | Ht 67.5 in | Wt 217.0 lb

## 2016-12-12 DIAGNOSIS — M545 Low back pain: Secondary | ICD-10-CM

## 2016-12-12 DIAGNOSIS — G8929 Other chronic pain: Secondary | ICD-10-CM

## 2016-12-12 DIAGNOSIS — G43D0 Abdominal migraine, not intractable: Secondary | ICD-10-CM

## 2016-12-12 DIAGNOSIS — E559 Vitamin D deficiency, unspecified: Secondary | ICD-10-CM

## 2016-12-12 DIAGNOSIS — Z0000 Encounter for general adult medical examination without abnormal findings: Secondary | ICD-10-CM

## 2016-12-12 NOTE — Progress Notes (Addendum)
   HPI  Patient presents today here with low back pain and for follow-up chronic medical conditions.  Pt is a nurse in our clinic.   Low back pain Has been bothering her for more than 20 years, started as a teenager without event. Describes pain when she first stands up. Does not limit her activity. She's concerned that if she keeps ignoring it he will likely get worse and cause serious problems. Helped by Aleve or Motrin. No leg weakness. Patient reports long-term paresthesias of the bilateral lateral thigh   Vit d deficiency Previously found to be 12.5, treated with 50,000 units weekly 3 months and not replaced.  Migraine headache Patient had sudden onset left ear ringing, dizziness, and right eye visual disturbance while hiking. That was followed by nausea that has lasted for about 3 days, now improved, and pounding right-sided throbbing type headache. She did have photophobia  No medications were used.   PMH: Smoking status noted ROS: Per HPI  Objective: BP 122/71   Pulse 75   Temp 97.6 F (36.4 C) (Oral)   Ht 5' 7.5" (1.715 m)   Wt 217 lb (98.4 kg)   BMI 33.49 kg/m  Gen: NAD, alert, cooperative with exam HEENT: NCAT CV: RRR, good S1/S2, no murmur Resp: CTABL, no wheezes, non-labored Ext: No edema, warm Neuro: Alert and oriented, No gross deficits MSK:  TTP midline lumbar spine and paraspinal muscles over SI joints.   Assessment and plan:  # Chronic midline low back pain With radiation onto the lateral thighs and the lateral femoral cutaneous nerve distribution, this is L2/L3 distribution. Pain is moderate and worsening, does not limit activity Plain film ordered with tenderness to palpation, physical therapy recommended  Consider lateral femoral cutaneous nerve impingement as it passes under inguinal ligament.   # Preventative health care Labs ordered, will return fasting  # Abdominal migraine Most likely explanation for episode, the episode of visual  disturbance and dizziness lasted only about 15 minutes, throbbing headache resolved slower and the nausea lasted about 3 days NSAIDs recommended  # Vitamin D deficiency Repeat levels, profoundly low previously   Orders Placed This Encounter  Procedures  . DG Lumbar Spine Complete    Standing Status:   Future    Standing Expiration Date:   02/11/2018    Order Specific Question:   Reason for Exam (SYMPTOM  OR DIAGNOSIS REQUIRED)    Answer:   low back pain- chronic, L 2 dist    Order Specific Question:   Is patient pregnant?    Answer:   No    Order Specific Question:   Preferred imaging location?    Answer:   Internal    Order Specific Question:   Radiology Contrast Protocol - do NOT remove file path    Answer:   [email or web address]  . CMP14+EGFR  . CBC with Differential/Platelet  . Lipid panel  . VITAMIN D 25 Hydroxy (Vit-D Deficiency, Fractures)  . TSH  . Ambulatory referral to Physical Therapy    Referral Priority:   Routine    Referral Type:   Physical Medicine    Referral Reason:   Specialty Services Required    Requested Specialty:   Physical Therapy    Number of Visits Requested:   [place], MD [facility] 12/12/2016, 2:54 PM

## 2016-12-28 ENCOUNTER — Ambulatory Visit: Payer: 59 | Attending: Family Medicine | Admitting: Physical Therapy

## 2016-12-28 DIAGNOSIS — M545 Low back pain: Secondary | ICD-10-CM | POA: Diagnosis not present

## 2016-12-28 DIAGNOSIS — G8929 Other chronic pain: Secondary | ICD-10-CM | POA: Insufficient documentation

## 2016-12-28 NOTE — Therapy (Signed)
Molly Henson, [place], [address] Phone: [phone]   Fax:  [phone]  Physical Therapy Evaluation  Patient Details  Name: Molly Henson MRN: [id number] Date of Birth: January 01, 1981 Referring Provider: Kenn File MD.  Encounter Date: 12/28/2016      PT End of Session - 12/28/16 1509    Visit Number 1   Number of Visits 12   Date for PT Re-Evaluation 02/08/17   PT Start Time 0100   PT Stop Time 0149   PT Time Calculation (min) 49 min   Activity Tolerance Patient tolerated treatment well   Behavior During Therapy [facility] for tasks assessed/performed      Past Medical History:  Diagnosis Date  . Allergy   . Heart murmur    as a childd  . Migraines   . Seasonal allergies     Past Surgical History:  Procedure Laterality Date  . CESAREAN SECTION  02/08/2004  . CESAREAN SECTION  07/2008  . CHOLECYSTECTOMY    . ECTOPIC PREGNANCY SURGERY  11/2000  . GALLBLADDER SURGERY  03/2004  . TUBAL LIGATION  07/2008    There were no vitals filed for this visit.       Subjective Assessment - 12/28/16 1426    Subjective The patient has a long h/o low back pain dating back to the early 90's.  She can not recall a specific injury.  Her pain today is a low 2/10 but can rise to much higher levels (7+/10) with increased activites or pressing on a certain area of her spine.  An X-ray revealed some facet degneration.  She also expriences right lateral thigh numbness and occasionally on the right.   Pertinent History Long h/o low back pain.   Patient Stated Goals Get out of pain.   Currently in Pain? Yes   Pain Score 2    Pain Location Back   Pain Descriptors / Indicators Dull   Pain Type Chronic pain   Pain Onset More than a month ago   Pain Frequency Constant   Aggravating Factors  See above.   Pain Relieving Factors Rest.            [facility] PT Assessment - 12/28/16 0001      Assessment   Medical Diagnosis  Chronic mid-line low back pain.   Referring Provider Kenn File MD.   Onset Date/Surgical Date --  [phone].     Precautions   Precautions None     Restrictions   Weight Bearing Restrictions No     Balance Screen   Has the patient fallen in the past 6 months No   Has the patient had a decrease in activity level because of a fear of falling?  No   Is the patient reluctant to leave their home because of a fear of falling?  No     Home Ecologist residence     Prior Function   Level of Independence Independent     Posture/Postural Control   Posture/Postural Control Postural limitations     ROM / Strength   AROM / PROM / Strength AROM;Strength     AROM   Overall AROM Comments Lumbar extension to 20 degrees with decreases pain and flexion decreased by 50%.  Bilateral SLR's= 45 degrees.     Strength   Overall Strength Comments Normal bilateral LE strength.     Palpation   Palpation comment Tender with overpressure at L5-S1.  Special Tests    Special Tests Lumbar;Leg LengthTest  Normal LE DTR's.   Lumbar Tests --  (-)SLR testing. (-) FABER testing.   Leg length test  --  Equal leg lengths.            Objective measurements completed on examination: See above findings.          [facility] Adult PT Treatment/Exercise - 12/28/16 0001      Modalities   Modalities Electrical Stimulation;Moist Hotel manager Location Lower lumbar.   Electrical Stimulation Action IFC    Electrical Stimulation Parameters 80-150 Hz x 20 minutes.   Electrical Stimulation Goals Pain                     PT Long Term Goals - 12/28/16 1513      PT LONG TERM GOAL #1   Title Independent with a HEP.   Time 6   Period Weeks   Status New     PT LONG TERM GOAL #2   Title Perform ADL's with pain not > 2/10.   Time 6   Period Weeks   Status New     PT LONG TERM GOAL #3   Title Eliminate  thigh numbness.   Time 6   Period Weeks   Status New                Plan - 12/28/16 1509    Clinical Impression Statement The patient presents to [facility] with chronic midline low back pain.  She is very tender to palpation with overpressure at L5-S1.  Her back feels better with extension.  She has limited spinal range of motion into flexion.  Her pain can rise to high levels at times for no apparent reason.  Patient will benefit from skilled physical therapy to address deficits and pain.   History and Personal Factors relevant to plan of care: Lonf h/o low back pain.   Clinical Presentation Stable   Clinical Decision Making Low   Rehab Potential Good   PT Frequency 2x / week   PT Duration 6 weeks   PT Treatment/Interventions ADLs/Self Care Home Management;Cryotherapy;Electrical Stimulation;Moist Heat;Ultrasound;Traction;Therapeutic exercise;Therapeutic activities;Functional mobility training;Patient/family education;Manual techniques;Dry needling   Consulted and Agree with Plan of Care Patient      Patient will benefit from skilled therapeutic intervention in order to improve the following deficits and impairments:  Decreased activity tolerance, Decreased range of motion  Visit Diagnosis: Chronic midline low back pain, with sciatica presence unspecified - Plan: PT plan of care cert/re-cert     Problem List Patient Active Problem List   Diagnosis Date Noted  . BMI 31.0-31.9,adult 09/10/2014  . Preventative health care 09/10/2014  . Other fatigue 09/10/2014    [name], [place] MPT 12/28/2016, 3:17 PM  [facility] [address] [place], [place], [address] Phone: [phone]   Fax:  [phone]  Name: Molly Henson MRN: [id number] Date of Birth: July 11, 1980

## 2017-01-01 ENCOUNTER — Encounter: Payer: Self-pay | Admitting: Physical Therapy

## 2017-01-01 ENCOUNTER — Ambulatory Visit: Payer: 59 | Admitting: Physical Therapy

## 2017-01-01 DIAGNOSIS — M545 Low back pain: Principal | ICD-10-CM

## 2017-01-01 DIAGNOSIS — G8929 Other chronic pain: Secondary | ICD-10-CM | POA: Diagnosis not present

## 2017-01-01 NOTE — Patient Instructions (Addendum)
Pelvic Tilt    Flatten back by tightening stomach muscles and buttocks. Don't hold your breath. Repeat __10__ times per set. Do __2__ sets per session. Do __2-3__ sessions per day. Hold for 5 seconds.  http://orth.exer.us/134   Copyright  VHI. All rights reserved.

## 2017-01-01 NOTE — Therapy (Addendum)
[facility] [place], [place], [address] Phone: [phone]   Fax:  [phone]  Physical Therapy Treatment  Patient Details  Name: ARNECIA ECTOR MRN: [id number] Date of Birth: May 04, 1981 Referring Provider: Kenn File MD.  Encounter Date: 01/01/2017      PT End of Session - 01/01/17 1303    Visit Number 2   Number of Visits 12   Date for PT Re-Evaluation 02/08/17   PT Start Time 1302   PT Stop Time 1327   PT Time Calculation (min) 25 min   Activity Tolerance Patient tolerated treatment well   Behavior During Therapy [facility] for tasks assessed/performed      Past Medical History:  Diagnosis Date  . Allergy   . Heart murmur    as a childd  . Migraines   . Seasonal allergies     Past Surgical History:  Procedure Laterality Date  . CESAREAN SECTION  02/08/2004  . CESAREAN SECTION  07/2008  . CHOLECYSTECTOMY    . ECTOPIC PREGNANCY SURGERY  11/2000  . GALLBLADDER SURGERY  03/2004  . TUBAL LIGATION  07/2008    There were no vitals filed for this visit.      Subjective Assessment - 01/01/17 1302    Subjective Reports that so far today her back is okay. Reports that thigh numbness is constant and has been present since the 90s. Pain can start upon waking at times and no real aggravating factor has been found.   Pertinent History Long h/o low back pain.   Patient Stated Goals Get out of pain.   Currently in Pain? No/denies   Aggravating Factors  None determined by patient   Pain Relieving Factors Rest ("usually")            [facility] PT Assessment - 01/01/17 0001      Assessment   Medical Diagnosis Chronic mid-line low back pain.     Precautions   Precautions None     Restrictions   Weight Bearing Restrictions No                     [facility] Adult PT Treatment/Exercise - 01/01/17 0001      Exercises   Exercises Lumbar     Lumbar Exercises: Aerobic   Stationary Bike NuStep L5 x10 min  with core activation     Lumbar Exercises: Supine   Ab Set 20 reps;5 seconds   Bent Knee Raise 20 reps;4 seconds   Bent Knee Raise Limitations with core activation   Bridge 10 reps;4 seconds  began feeling uncomfortable sensation     Lumbar Exercises: Prone   Other Prone Lumbar Exercises POE x2 min, POE with L roadkill x1 min   Other Prone Lumbar Exercises Prone pressups x10 reps 3 sec hold                PT Education - 01/01/17 1338    Education provided Yes   Education Details Ab set   Person(s) Educated Patient   Methods Explanation;Handout;Verbal cues   Comprehension Verbalized understanding;Verbal cues required             PT Long Term Goals - 12/28/16 1513      PT LONG TERM GOAL #1   Title Independent with a HEP.   Time 6   Period Weeks   Status New     PT LONG TERM GOAL #2   Title Perform ADL's with pain not > 2/10.   Time  6   Period Weeks   Status New     PT LONG TERM GOAL #3   Title Eliminate thigh numbness.   Time 6   Period Weeks   Status New               Plan - 01/01/17 1331    Clinical Impression Statement Patient presented in clinic with no current LBP today but continued numbness along L hip which she states she has noticed moving into R low back as well. Patient educated regarding purpose of core activation and guided through initial core/lumbar strengthening exercises with uncomfortable sensation noted by patient following several reps of briding. Patient was also guided through prone exercises with pain reported by patient with prone on elbows with L roadkill. Patient provided HEP for core activation to gradually increase strength and to focus on basic core strength first.    Rehab Potential Good   PT Frequency 2x / week   PT Duration 6 weeks   PT Treatment/Interventions ADLs/Self Care Home Management;Cryotherapy;Electrical Stimulation;Moist Heat;Ultrasound;Traction;Therapeutic exercise;Therapeutic activities;Functional  mobility training;Patient/family education;Manual techniques;Dry needling   PT Next Visit Plan Mckenzie lumbar exercises in standing and prone; supine HSS; hip bridges; abd strengthening.  Modalites and STW/M.  Can try int lumbar traction at 40% body weight.   PT Home Exercise Plan HEP- ab set   Consulted and Agree with Plan of Care Patient      Patient will benefit from skilled therapeutic intervention in order to improve the following deficits and impairments:  Decreased activity tolerance, Decreased range of motion  Visit Diagnosis: Chronic midline low back pain, with sciatica presence unspecified     Problem List Patient Active Problem List   Diagnosis Date Noted  . BMI 31.0-31.9,adult 09/10/2014  . Preventative health care 09/10/2014  . Other fatigue 09/10/2014    Wynelle Fanny, PTA 01/01/2017, 1:39 PM  [facility] [address] [place], [place], [address] Phone: [phone]   Fax:  [phone]  Name: ALLYNA PITTSLEY MRN: [id number] Date of Birth: Jun 27, 1980 PHYSICAL THERAPY DISCHARGE SUMMARY  Visits from Start of Care: 2.  Current functional level related to goals / functional outcomes: See above.   Remaining deficits: See below.   Education / Equipment: HEP. Plan: Patient agrees to discharge.  Patient goals were not met. Patient is being discharged due to not returning since the last visit.  ?????         Mali Applegate MPT

## 2017-01-08 ENCOUNTER — Encounter: Payer: 59 | Admitting: Physical Therapy

## 2017-06-05 ENCOUNTER — Other Ambulatory Visit: Payer: Self-pay | Admitting: *Deleted

## 2017-06-05 MED ORDER — FLUCONAZOLE 150 MG PO TABS: ORAL_TABLET | ORAL | 0 refills | Status: DC

## 2017-06-12 ENCOUNTER — Encounter: Payer: Self-pay | Admitting: Nurse Practitioner

## 2017-06-12 ENCOUNTER — Ambulatory Visit (INDEPENDENT_AMBULATORY_CARE_PROVIDER_SITE_OTHER): Payer: 59 | Admitting: Nurse Practitioner

## 2017-06-12 VITALS — BP 115/61 | HR 75 | Temp 98.6°F | Ht 67.0 in | Wt 223.0 lb

## 2017-06-12 DIAGNOSIS — Z6834 Body mass index (BMI) 34.0-34.9, adult: Secondary | ICD-10-CM | POA: Diagnosis not present

## 2017-06-12 DIAGNOSIS — R635 Abnormal weight gain: Secondary | ICD-10-CM

## 2017-06-12 MED ORDER — PHENTERMINE HCL 37.5 MG PO TABS: 37.5000 mg | ORAL_TABLET | Freq: Every day | ORAL | 3 refills | Status: DC

## 2017-06-12 NOTE — Progress Notes (Signed)
   Subjective:    Patient ID: Molly Henson, female    DOB: Oct 01, 1980, 37 y.o.   MRN: [id number]  HPI Patient comes in today c/o weight gain. She has gained about 25lbs in the last 3 months. 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    Review of Systems  Constitutional: Negative.  Negative for activity change and appetite change.  HENT: Negative.   Eyes: Negative for pain.  Respiratory: Negative for shortness of breath.   Cardiovascular: Negative for chest pain, palpitations and leg swelling.  Gastrointestinal: Negative for abdominal pain.  Endocrine: Negative for polydipsia.  Genitourinary: Negative.   Skin: Negative for rash.  Neurological: Negative for dizziness, weakness and headaches.  Hematological: Does not bruise/bleed easily.  Psychiatric/Behavioral: Negative.   All other systems reviewed and are negative.      Objective:   Physical Exam  Constitutional: She is oriented to person, place, and time. She appears well-developed and well-nourished. No distress.  Cardiovascular: Normal rate and regular rhythm.  Pulmonary/Chest: Effort normal and breath sounds normal.  Neurological: She is alert and oriented to person, place, and time.  Skin: Skin is warm.  Psychiatric: She has a normal mood and affect. Her behavior is normal. Judgment and thought content normal.   BP 115/61   Pulse 75   Temp 98.6 F (37 C) (Oral)   Ht 5\' 7"  (1.702 m)   Wt 223 lb (101.2 kg)   BMI 34.93 kg/m       Assessment & Plan:  1. BMI 34.0-34.9,adult Increase protein in diet and decrease carbs Meds ordered this encounter  Medications  . phentermine (ADIPEX-P) 37.5 MG tablet    Sig: Take 1 tablet (37.5 mg total) by mouth daily before breakfast.    Dispense:  30 tablet    Refill:  3    Order Specific Question:   Supervising Provider    Answer:   VINCENT, CAROL L [[id number]]   RTO in 2 months follow up  [place], FNP

## 2017-06-12 NOTE — Patient Instructions (Signed)
Exercising to Lose Weight Exercising can help you to lose weight. In order to lose weight through exercise, you need to do vigorous-intensity exercise. You can tell that you are exercising with vigorous intensity if you are breathing very hard and fast and cannot hold a conversation while exercising. Moderate-intensity exercise helps to maintain your current weight. You can tell that you are exercising at a moderate level if you have a higher heart rate and faster breathing, but you are still able to hold a conversation. How often should I exercise? Choose an activity that you enjoy and set realistic goals. Your health care provider can help you to make an activity plan that works for you. Exercise regularly as directed by your health care provider. This may include:  Doing resistance training twice each week, such as: ? Push-ups. ? Sit-ups. ? Lifting weights. ? Using resistance bands.  Doing a given intensity of exercise for a given amount of time. Choose from these options: ? 150 minutes of moderate-intensity exercise every week. ? 75 minutes of vigorous-intensity exercise every week. ? A mix of moderate-intensity and vigorous-intensity exercise every week.  Children, pregnant women, people who are out of shape, people who are overweight, and older adults may need to consult a health care provider for individual recommendations. If you have any sort of medical condition, be sure to consult your health care provider before starting a new exercise program. What are some activities that can help me to lose weight?  Walking at a rate of at least 4.5 miles an hour.  Jogging or running at a rate of 5 miles per hour.  Biking at a rate of at least 10 miles per hour.  Lap swimming.  Roller-skating or in-line skating.  Cross-country skiing.  Vigorous competitive sports, such as football, basketball, and soccer.  Jumping rope.  Aerobic dancing. How can I be more active in my day-to-day  activities?  Use the stairs instead of the elevator.  Take a walk during your lunch break.  If you drive, park your car farther away from work or school.  If you take public transportation, get off one stop early and walk the rest of the way.  Make all of your phone calls while standing up and walking around.  Get up, stretch, and walk around every 30 minutes throughout the day. What guidelines should I follow while exercising?  Do not exercise so much that you hurt yourself, feel dizzy, or get very short of breath.  Consult your health care provider prior to starting a new exercise program.  Wear comfortable clothes and shoes with good support.  Drink plenty of water while you exercise to prevent dehydration or heat stroke. Body water is lost during exercise and must be replaced.  Work out until you breathe faster and your heart beats faster. This information is not intended to replace advice given to you by your health care provider. Make sure you discuss any questions you have with your health care provider. Document Released: 06/03/2010 Document Revised: 10/07/2015 Document Reviewed: 10/02/2013 Elsevier Interactive Patient Education  2018 Elsevier Inc[employer]  

## 2017-06-18 DIAGNOSIS — H52223 Regular astigmatism, bilateral: Secondary | ICD-10-CM | POA: Diagnosis not present

## 2017-06-18 DIAGNOSIS — H5203 Hypermetropia, bilateral: Secondary | ICD-10-CM | POA: Diagnosis not present

## 2018-01-08 ENCOUNTER — Other Ambulatory Visit: Payer: 59

## 2018-01-08 ENCOUNTER — Other Ambulatory Visit: Payer: Self-pay | Admitting: Nurse Practitioner

## 2018-01-08 DIAGNOSIS — R5383 Other fatigue: Secondary | ICD-10-CM

## 2018-01-08 MED ORDER — DOXYCYCLINE HYCLATE 100 MG PO TABS: 100.0000 mg | ORAL_TABLET | Freq: Two times a day (BID) | ORAL | 0 refills | Status: DC

## 2018-01-08 NOTE — Progress Notes (Signed)
doxy 

## 2018-01-10 LAB — CBC WITH DIFFERENTIAL/PLATELET
BASOS: 1 %
Basophils Absolute: 0 10*3/uL (ref 0.0–0.2)
EOS (ABSOLUTE): 0.2 10*3/uL (ref 0.0–0.4)
EOS: 3 %
HEMATOCRIT: 41.8 % (ref 34.0–46.6)
Hemoglobin: 13.8 g/dL (ref 11.1–15.9)
IMMATURE GRANS (ABS): 0 10*3/uL (ref 0.0–0.1)
Immature Granulocytes: 0 %
LYMPHS: 29 %
Lymphocytes Absolute: 2.1 10*3/uL (ref 0.7–3.1)
MCH: 29.5 pg (ref 26.6–33.0)
MCHC: 33 g/dL (ref 31.5–35.7)
MCV: 89 fL (ref 79–97)
MONOS ABS: 0.6 10*3/uL (ref 0.1–0.9)
Monocytes: 9 %
NEUTROS ABS: 4.1 10*3/uL (ref 1.4–7.0)
NEUTROS PCT: 58 %
Platelets: 269 10*3/uL (ref 150–450)
RBC: 4.68 x10E6/uL (ref 3.77–5.28)
RDW: 12.5 % (ref 12.3–15.4)
WBC: 7.1 10*3/uL (ref 3.4–10.8)

## 2018-01-10 LAB — LYME AB/WESTERN BLOT REFLEX: Lyme IgG/IgM Ab: <0.91 {ISR} (ref 0.00–0.90)

## 2018-01-10 LAB — ROCKY MTN SPOTTED FVR ABS PNL(IGG+IGM)
RMSF IgG: NEGATIVE
RMSF IgM: 0.43 index (ref 0.00–0.89)

## 2018-01-10 LAB — VITAMIN D 25 HYDROXY (VIT D DEFICIENCY, FRACTURES): Vit D, 25-Hydroxy: 27.9 ng/mL — ABNORMAL LOW (ref 30.0–100.0)

## 2018-12-27 ENCOUNTER — Other Ambulatory Visit: Payer: Self-pay

## 2018-12-27 ENCOUNTER — Encounter: Payer: Self-pay | Admitting: Family Medicine

## 2018-12-27 ENCOUNTER — Ambulatory Visit (INDEPENDENT_AMBULATORY_CARE_PROVIDER_SITE_OTHER): Payer: No Typology Code available for payment source | Admitting: Family Medicine

## 2018-12-27 VITALS — BP 118/71 | HR 75 | Temp 97.8°F | Ht 67.0 in | Wt 229.0 lb

## 2018-12-27 DIAGNOSIS — Z1239 Encounter for other screening for malignant neoplasm of breast: Secondary | ICD-10-CM

## 2018-12-27 DIAGNOSIS — Z131 Encounter for screening for diabetes mellitus: Secondary | ICD-10-CM

## 2018-12-27 DIAGNOSIS — R5383 Other fatigue: Secondary | ICD-10-CM

## 2018-12-27 DIAGNOSIS — Z6835 Body mass index (BMI) 35.0-35.9, adult: Secondary | ICD-10-CM

## 2018-12-27 DIAGNOSIS — K625 Hemorrhage of anus and rectum: Secondary | ICD-10-CM | POA: Insufficient documentation

## 2018-12-27 DIAGNOSIS — Z1329 Encounter for screening for other suspected endocrine disorder: Secondary | ICD-10-CM

## 2018-12-27 DIAGNOSIS — Z13220 Encounter for screening for lipoid disorders: Secondary | ICD-10-CM

## 2018-12-27 DIAGNOSIS — Z1211 Encounter for screening for malignant neoplasm of colon: Secondary | ICD-10-CM

## 2018-12-27 DIAGNOSIS — Z0000 Encounter for general adult medical examination without abnormal findings: Secondary | ICD-10-CM | POA: Diagnosis not present

## 2018-12-27 DIAGNOSIS — K644 Residual hemorrhoidal skin tags: Secondary | ICD-10-CM

## 2018-12-27 MED ORDER — HYDROCORTISONE ACETATE 25 MG RE SUPP: 25.0000 mg | Freq: Two times a day (BID) | RECTAL | 0 refills | Status: DC

## 2018-12-27 NOTE — Patient Instructions (Signed)
Health Maintenance, Female Adopting a healthy lifestyle and getting preventive care are important in promoting health and wellness. Ask your health care provider about:  The right schedule for you to have regular tests and exams.  Things you can do on your own to prevent diseases and keep yourself healthy. What should I know about diet, weight, and exercise? Eat a healthy diet   Eat a diet that includes plenty of vegetables, fruits, low-fat dairy products, and lean protein.  Do not eat a lot of foods that are high in solid fats, added sugars, or sodium. Maintain a healthy weight Body mass index (BMI) is used to identify weight problems. It estimates body fat based on height and weight. Your health care provider can help determine your BMI and help you achieve or maintain a healthy weight. Get regular exercise Get regular exercise. This is one of the most important things you can do for your health. Most adults should:  Exercise for at least 150 minutes each week. The exercise should increase your heart rate and make you sweat (moderate-intensity exercise).  Do strengthening exercises at least twice a week. This is in addition to the moderate-intensity exercise.  Spend less time sitting. Even light physical activity can be beneficial. Watch cholesterol and blood lipids Have your blood tested for lipids and cholesterol at 40 years of age, then have this test every 5 years. Have your cholesterol levels checked more often if:  Your lipid or cholesterol levels are high.  You are older than 40 years of age.  You are at high risk for heart disease. What should I know about cancer screening? Depending on your health history and family history, you may need to have cancer screening at various ages. This may include screening for:  Breast cancer.  Cervical cancer.  Colorectal cancer.  Skin cancer.  Lung cancer. What should I know about heart disease, diabetes, and high blood  pressure? Blood pressure and heart disease  High blood pressure causes heart disease and increases the risk of stroke. This is more likely to develop in people who have high blood pressure readings, are of African descent, or are overweight.  Have your blood pressure checked: ? Every 3-5 years if you are 18-39 years of age. ? Every year if you are 40 years old or older. Diabetes Have regular diabetes screenings. This checks your fasting blood sugar level. Have the screening done:  Once every three years after age 40 if you are at a normal weight and have a low risk for diabetes.  More often and at a younger age if you are overweight or have a high risk for diabetes. What should I know about preventing infection? Hepatitis B If you have a higher risk for hepatitis B, you should be screened for this virus. Talk with your health care provider to find out if you are at risk for hepatitis B infection. Hepatitis C Testing is recommended for:  Everyone born from 1945 through 1965.  Anyone with known risk factors for hepatitis C. Sexually transmitted infections (STIs)  Get screened for STIs, including gonorrhea and chlamydia, if: ? You are sexually active and are younger than 40 years of age. ? You are older than 40 years of age and your health care provider tells you that you are at risk for this type of infection. ? Your sexual activity has changed since you were last screened, and you are at increased risk for chlamydia or gonorrhea. Ask your health care provider if   you are at risk.  Ask your health care provider about whether you are at high risk for HIV. Your health care provider may recommend a prescription medicine to help prevent HIV infection. If you choose to take medicine to prevent HIV, you should first get tested for HIV. You should then be tested every 3 months for as long as you are taking the medicine. Pregnancy  If you are about to stop having your period (premenopausal) and  you may become pregnant, seek counseling before you get pregnant.  Take 400 to 800 micrograms (mcg) of folic acid every day if you become pregnant.  Ask for birth control (contraception) if you want to prevent pregnancy. Osteoporosis and menopause Osteoporosis is a disease in which the bones lose minerals and strength with aging. This can result in bone fractures. If you are 65 years old or older, or if you are at risk for osteoporosis and fractures, ask your health care provider if you should:  Be screened for bone loss.  Take a calcium or vitamin D supplement to lower your risk of fractures.  Be given hormone replacement therapy (HRT) to treat symptoms of menopause. Follow these instructions at home: Lifestyle  Do not use any products that contain nicotine or tobacco, such as cigarettes, e-cigarettes, and chewing tobacco. If you need help quitting, ask your health care provider.  Do not use street drugs.  Do not share needles.  Ask your health care provider for help if you need support or information about quitting drugs. Alcohol use  Do not drink alcohol if: ? Your health care provider tells you not to drink. ? You are pregnant, may be pregnant, or are planning to become pregnant.  If you drink alcohol: ? Limit how much you use to 0-1 drink a day. ? Limit intake if you are breastfeeding.  Be aware of how much alcohol is in your drink. In the [place] one drink equals one 12 oz bottle of beer (355 mL), one 5 oz glass of wine (148 mL), or one 1 oz glass of hard liquor (44 mL). General instructions  Schedule regular health, dental, and eye exams.  Stay current with your vaccines.  Tell your health care provider if: ? You often feel depressed. ? You have ever been abused or do not feel safe at home. Summary  Adopting a healthy lifestyle and getting preventive care are important in promoting health and wellness.  Follow your health care provider's instructions about healthy  diet, exercising, and getting tested or screened for diseases.  Follow your health care provider's instructions on monitoring your cholesterol and blood pressure. This information is not intended to replace advice given to you by your health care provider. Make sure you discuss any questions you have with your health care provider. Document Released: 11/14/2010 Document Revised: 04/24/2018 Document Reviewed: 04/24/2018 Elsevier Patient Education  2020 [employer] Inc.  

## 2018-12-27 NOTE — Progress Notes (Signed)
Subjective:  Patient ID: Molly Henson, female    DOB: [birth year], 38 y.o.   MRN: [id number]  Patient Care Team: Baruch Gouty, FNP as PCP - General (Family Medicine)   Chief Complaint:  Annual Exam   HPI: Molly Henson is a 38 y.o. female presenting on 12/27/2018 for Annual Exam   Pt presents today for her annual physical exam. Pt states Molly Henson has been doing fairly well overall. States Molly Henson does have fatigue on a daily basis and has noticed an increase in weight. Molly Henson states Molly Henson does sleep pretty well but still feels tired. Molly Henson has a history of thyroid disease in her family. Molly Henson has never been diagnosed with thyroid problems. Pt states Molly Henson has diarrhea at times. States this has been ongoing since her cholecystectomy. States Molly Henson does have hemorrhoids and has noted some bright red blood in her stools at times. No dizziness, palpitations, chest pain, weakness, or syncope. Pt states Molly Henson has not tried anything for the hemorrhoids and has not been evaluated by GI. No history of CRC in the family. 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 Molly Henson does not watch her diet or exercise on a regular basis. Molly Henson is due to a mammogram as Molly Henson has recently turned 5. No history of breast cancer in first degree relative.  Has PAPs completed at OB/GYN in [place], [place].     Relevant past medical, surgical, family, and social history reviewed and updated as indicated.  Allergies and medications reviewed and updated. Date reviewed: Chart in Epic.   Past Medical History:  Diagnosis Date  . Allergy   . Heart murmur    as a childd  . Migraines   . Seasonal allergies     Past Surgical History:  Procedure Laterality Date  . CESAREAN SECTION  02/08/2004  . CESAREAN SECTION  07/2008  . CHOLECYSTECTOMY    . ECTOPIC PREGNANCY SURGERY  11/2000  . GALLBLADDER SURGERY  03/2004  . TUBAL LIGATION  07/2008    Social History   Socioeconomic History  . Marital status: Married    Spouse name:  Not on file  . Number of children: 2  . Years of education: Not on file  . Highest education level: Not on file  Occupational History  . Occupation: Economist: [employer]  . Financial resource strain: Not on file  . Food insecurity    Worry: Not on file    Inability: Not on file  . Transportation needs    Medical: Not on file    Non-medical: Not on file  Tobacco Use  . Smoking status: Never Smoker  . Smokeless tobacco: Never Used  Substance and Sexual Activity  . Alcohol use: No    Alcohol/week: 0.0 standard drinks  . Drug use: No  . Sexual activity: Yes    Birth control/protection: Surgical  Lifestyle  . Physical activity    Days per week: Not on file    Minutes per session: Not on file  . Stress: Not on file  Relationships  . Social Herbalist on phone: Not on file    Gets together: Not on file    Attends religious service: Not on file    Active member of club or organization: Not on file    Attends meetings of clubs or organizations: Not on file    Relationship status: Not on file  . Intimate partner  violence    Fear of current or ex partner: Not on file    Emotionally abused: Not on file    Physically abused: Not on file    Forced sexual activity: Not on file  Other Topics Concern  . Not on file  Social History Narrative   Molly Henson is divorced & remarried. Molly Henson lives with her husband, 2 biological children & step-son.   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    Outpatient Encounter Medications as of 12/27/2018  Medication Sig  . hydrocortisone (ANUSOL-HC) 25 MG suppository Place 1 suppository (25 mg total) rectally 2 (two) times daily.  . [DISCONTINUED] doxycycline (VIBRA-TABS) 100 MG tablet Take 1 tablet (100 mg total) by mouth 2 (two) times daily. 1 po bid  . [DISCONTINUED] phentermine (ADIPEX-P) 37.5 MG tablet Take 1 tablet (37.5 mg total) by mouth daily before breakfast.   No facility-administered  encounter medications on file as of 12/27/2018.     No Known Allergies  Review of Systems  Constitutional: Positive for fatigue and unexpected weight change. Negative for activity change, appetite change, chills, diaphoresis and fever.  HENT: Negative.   Eyes: Negative.  Negative for photophobia and visual disturbance.  Respiratory: Negative for cough, chest tightness, shortness of breath, wheezing and stridor.   Cardiovascular: Negative for chest pain, palpitations and leg swelling.  Gastrointestinal: Positive for blood in stool. Negative for abdominal distention, abdominal pain, anal bleeding, constipation, diarrhea, nausea, rectal pain and vomiting.  Endocrine: Negative.  Negative for cold intolerance, heat intolerance, polydipsia, polyphagia and polyuria.  Genitourinary: Negative for decreased urine volume, difficulty urinating, dysuria, frequency, menstrual problem and urgency.  Musculoskeletal: Positive for back pain (intermittent, ongoing for years). Negative for arthralgias, gait problem, joint swelling, myalgias, neck pain and neck stiffness.  Skin: Negative.  Negative for color change, pallor, rash and wound.  Allergic/Immunologic: Negative.   Neurological: Negative for dizziness, tremors, seizures, syncope, facial asymmetry, speech difficulty, weakness, light-headedness, numbness and headaches.  Hematological: Negative.  Negative for adenopathy. Does not bruise/bleed easily.  Psychiatric/Behavioral: Negative for agitation, behavioral problems, confusion, decreased concentration, dysphoric mood, hallucinations, self-injury, sleep disturbance and suicidal ideas. The patient is not nervous/anxious and is not hyperactive.   All other systems reviewed and are negative.       Objective:  BP 118/71   Pulse 75   Temp 97.8 F (36.6 C) (Oral)   Ht 5' 7" (1.702 m)   Wt 229 lb (103.9 kg)   BMI 35.87 kg/m    Wt Readings from Last 3 Encounters:  12/27/18 229 lb (103.9 kg)  06/12/17  223 lb (101.2 kg)  12/12/16 217 lb (98.4 kg)    Physical Exam Vitals signs and nursing note reviewed.  Constitutional:      General: Molly Henson is not in acute distress.    Appearance: Normal appearance. Molly Henson is well-developed and well-groomed. Molly Henson is obese. Molly Henson is not ill-appearing, toxic-appearing or diaphoretic.  HENT:     Head: Normocephalic and atraumatic.     Jaw: There is normal jaw occlusion.     Right Ear: Hearing, tympanic membrane, ear canal and external ear normal.     Left Ear: Hearing, tympanic membrane, ear canal and external ear normal.     Nose: Nose normal.     Mouth/Throat:     Lips: Pink.     Mouth: Mucous membranes are moist.     Pharynx: Oropharynx is clear. Uvula midline.  Eyes:     General: Lids are normal.  Extraocular Movements: Extraocular movements intact.     Conjunctiva/sclera: Conjunctivae normal.     Pupils: Pupils are equal, round, and reactive to light.  Neck:     Musculoskeletal: Normal range of motion and neck supple.     Thyroid: No thyroid mass, thyromegaly or thyroid tenderness.     Vascular: No carotid bruit or JVD.     Trachea: Trachea and phonation normal.  Cardiovascular:     Rate and Rhythm: Normal rate and regular rhythm.     Chest Wall: PMI is not displaced.     Pulses: Normal pulses.     Heart sounds: Normal heart sounds. No murmur. No friction rub. No gallop.   Pulmonary:     Effort: Pulmonary effort is normal. No respiratory distress.     Breath sounds: Normal breath sounds. No wheezing.  Chest:     Breasts:        Right: Normal.        Left: Normal.  Abdominal:     General: Abdomen is protuberant. Bowel sounds are normal. There is no distension or abdominal bruit.     Palpations: Abdomen is soft. There is no hepatomegaly or splenomegaly.     Tenderness: There is no abdominal tenderness. There is no right CVA tenderness or left CVA tenderness.     Hernia: No hernia is present.  Genitourinary:    Comments: Deferred, pt sees  OB/GYN for PAPs Musculoskeletal: Normal range of motion.     Right lower leg: No edema.     Left lower leg: No edema.  Lymphadenopathy:     Cervical: No cervical adenopathy.     Upper Body:     Right upper body: No supraclavicular or axillary adenopathy.     Left upper body: No supraclavicular or axillary adenopathy.  Skin:    General: Skin is warm and dry.     Capillary Refill: Capillary refill takes less than 2 seconds.     Coloration: Skin is not cyanotic, jaundiced or pale.     Findings: No rash.       Neurological:     General: No focal deficit present.     Mental Status: Molly Henson is alert and oriented to person, place, and time.     Cranial Nerves: Cranial nerves are intact.     Sensory: Sensation is intact.     Motor: Motor function is intact.     Coordination: Coordination is intact.     Gait: Gait is intact.     Deep Tendon Reflexes: Reflexes are normal and symmetric.  Psychiatric:        Attention and Perception: Attention and perception normal.        Mood and Affect: Mood and affect normal.        Speech: Speech normal.        Behavior: Behavior normal. Behavior is cooperative.        Thought Content: Thought content normal.        Cognition and Memory: Cognition and memory normal.        Judgment: Judgment normal.     Results for orders placed or performed in visit on 01/08/18  Lyme Ab/Western Blot Reflex  Result Value Ref Range   Lyme IgG/IgM Ab <0.91 0.00 - 0.90 ISR   LYME DISEASE AB, QUANT, IGM <0.80 0.00 - 0.79 index  Rocky mtn spotted fvr abs pnl(IgG+IgM)  Result Value Ref Range   RMSF IgG Negative Negative   RMSF IgM 0.43 0.00 - 0.89 index  CBC with Differential/Platelet  Result Value Ref Range   WBC 7.1 3.4 - 10.8 x10E3/uL   RBC 4.68 3.77 - 5.28 x10E6/uL   Hemoglobin 13.8 11.1 - 15.9 g/dL   Hematocrit 41.8 34.0 - 46.6 %   MCV 89 79 - 97 fL   MCH 29.5 26.6 - 33.0 pg   MCHC 33.0 31.5 - 35.7 g/dL   RDW 12.5 12.3 - 15.4 %   Platelets 269 150 - 450  x10E3/uL   Neutrophils 58 Not Estab. %   Lymphs 29 Not Estab. %   Monocytes 9 Not Estab. %   Eos 3 Not Estab. %   Basos 1 Not Estab. %   Neutrophils Absolute 4.1 1.4 - 7.0 x10E3/uL   Lymphocytes Absolute 2.1 0.7 - 3.1 x10E3/uL   Monocytes Absolute 0.6 0.1 - 0.9 x10E3/uL   EOS (ABSOLUTE) 0.2 0.0 - 0.4 x10E3/uL   Basophils Absolute 0.0 0.0 - 0.2 x10E3/uL   Immature Granulocytes 0 Not Estab. %   Immature Grans (Abs) 0.0 0.0 - 0.1 x10E3/uL  VITAMIN D 25 Hydroxy (Vit-D Deficiency, Fractures)  Result Value Ref Range   Vit D, 25-Hydroxy 27.9 (L) 30.0 - 100.0 ng/mL       Pertinent labs & imaging results that were available during my care of the patient were reviewed by me and considered in my medical decision making.  Assessment & Plan:  Molly Henson was seen today for annual exam.  Diagnoses and all orders for this visit:  Annual physical exam Health maintenance discussed. Will update Tdap soon. PAPs at OB/GYN office. Diet and exercise encouraged. Labs pending. -     Thyroid Panel With TSH -     Lipid panel -     CMP14+EGFR -     CBC with Differential/Platelet -     MM 3D SCREEN BREAST BILATERAL; Future -     Cologuard  BMI 35.0-35.9,adult Diet and exercise encouraged. Discussed possible medical management, declined at this time, wants to change diet and start exercising. Will monitor progress.  -     Thyroid Panel With TSH -     Lipid panel -     CMP14+EGFR -     CBC with Differential/Platelet  Other fatigue Sleep hygiene discussed. Will check below.  -     Thyroid Panel With TSH -     CMP14+EGFR -     CBC with Differential/Platelet  Rectal bleeding Hemorrhoids, external Intermittent, will refer to GI for further evaluation. Anusol as needed for hemorrhoids. Report any new or worsening symptoms.  -     Ambulatory referral to Gastroenterology -     hydrocortisone (ANUSOL-HC) 25 MG suppository; Place 1 suppository (25 mg total) rectally 2 (two) times daily.  Colon cancer  screening Due to report of intermittent rectal bleeding, pt would like to have cologuard testing. Discussed risks and benefits. Pt wishes to proceed. Test ordered.  -     Cologuard  Screening for lipoid disorders -     Lipid panel  Screening for malignant neoplasm of breast -     MM 3D SCREEN BREAST BILATERAL; Future  Screening for thyroid disorder -     Thyroid Panel With TSH  Screening for diabetes mellitus -     CMP14+EGFR     Continue all other maintenance medications.  Follow up plan: Return in 1 year (on 12/27/2019), or if symptoms worsen or fail to improve.  Continue healthy lifestyle choices, including diet (rich in fruits, vegetables, and lean proteins, and low  in salt and simple carbohydrates) and exercise (at least 30 minutes of moderate physical activity daily).  Educational handout given for health maintenance   The above assessment and management plan was discussed with the patient. The patient verbalized understanding of and has agreed to the management plan. Patient is aware to call the clinic if symptoms persist or worsen. Patient is aware when to return to the clinic for a follow-up visit. Patient educated on when it is appropriate to go to the emergency department.   Monia Pouch, FNP-C [facility] [phone] 12/28/18

## 2019-02-10 ENCOUNTER — Other Ambulatory Visit: Payer: Self-pay | Admitting: *Deleted

## 2019-02-10 MED ORDER — FLUCONAZOLE 150 MG PO TABS: 150.0000 mg | ORAL_TABLET | Freq: Once | ORAL | 0 refills | Status: AC

## 2019-03-04 ENCOUNTER — Other Ambulatory Visit: Payer: No Typology Code available for payment source

## 2019-03-04 ENCOUNTER — Other Ambulatory Visit: Payer: Self-pay

## 2019-03-04 DIAGNOSIS — R300 Dysuria: Secondary | ICD-10-CM

## 2019-03-04 LAB — URINALYSIS, COMPLETE
Bilirubin, UA: NEGATIVE
Glucose, UA: NEGATIVE
Ketones, UA: NEGATIVE
Leukocytes,UA: NEGATIVE
Nitrite, UA: NEGATIVE
Protein,UA: NEGATIVE
RBC, UA: NEGATIVE
Specific Gravity, UA: 1.025 (ref 1.005–1.030)
Urobilinogen, Ur: 0.2 mg/dL (ref 0.2–1.0)
pH, UA: 7 (ref 5.0–7.5)

## 2019-03-04 LAB — MICROSCOPIC EXAMINATION: Renal Epithel, UA: NONE SEEN /hpf

## 2019-03-06 LAB — URINE CULTURE

## 2019-12-25 ENCOUNTER — Encounter: Payer: Self-pay | Admitting: Nurse Practitioner

## 2019-12-25 ENCOUNTER — Ambulatory Visit (INDEPENDENT_AMBULATORY_CARE_PROVIDER_SITE_OTHER): Payer: 59 | Admitting: Nurse Practitioner

## 2019-12-25 ENCOUNTER — Other Ambulatory Visit: Payer: Self-pay

## 2019-12-25 ENCOUNTER — Ambulatory Visit (INDEPENDENT_AMBULATORY_CARE_PROVIDER_SITE_OTHER): Payer: 59

## 2019-12-25 VITALS — BP 113/66 | HR 74 | Temp 97.6°F | Ht 67.0 in | Wt 220.0 lb

## 2019-12-25 DIAGNOSIS — R1032 Left lower quadrant pain: Secondary | ICD-10-CM

## 2019-12-25 NOTE — Progress Notes (Signed)
   Subjective:    Patient ID: Molly Henson, female    DOB: March 17, 1981, 39 y.o.   MRN: [id number]   Chief Complaint: Abdominal Pain   HPI Patient comes in today c/o abdominal pain  Along left lower quadrant. She switches back from constipation and diarrhea for several years. She has been controlling it with her diet, because she knows what gives her problems and what does  Not. She had gall bladder removed in 2005.  For the last couple of bowel movements she has had a significant amount of blood in stool. Lots of cramping after eating certain foods. Feels blottted all the time. She has tried probiotics which really did not seem to help.   Review of Systems  Constitutional: Negative for diaphoresis.  Eyes: Negative for pain.  Respiratory: Negative for shortness of breath.   Cardiovascular: Negative for chest pain, palpitations and leg swelling.  Gastrointestinal: Positive for abdominal pain, blood in stool, constipation and diarrhea.  Endocrine: Negative for polydipsia.  Skin: Negative for rash.  Neurological: Negative for dizziness, weakness and headaches.  Hematological: Does not bruise/bleed easily.  All other systems reviewed and are negative.      Objective:   Physical Exam Vitals and nursing note reviewed.  Constitutional:      Appearance: She is well-developed.  Cardiovascular:     Heart sounds: Normal heart sounds.  Pulmonary:     Effort: Pulmonary effort is normal.     Breath sounds: Normal breath sounds.  Abdominal:     General: Abdomen is flat. Bowel sounds are normal. There is no distension or abdominal bruit.     Tenderness: There is no abdominal tenderness.  Neurological:     Mental Status: She is alert.    BP 113/66   Pulse 74   Temp 97.6 F (36.4 C) (Oral)   Ht 5\' 7"  (1.702 m)   Wt 220 lb (99.8 kg)   BMI 34.46 kg/m     KUB- moderate stool burden- Preliminary reading by Ronnald Collum, FNP  [facility]     Assessment & Plan:  Molly Henson in today  with chief complaint of Abdominal Pain   1. Left lower quadrant abdominal pain Possible IBS Does have moderate stool burden- MOM and prunie juice this weekend If no better next week , will do GI referral force fluids - DG Abd 1 View    The above assessment and management plan was discussed with the patient. The patient verbalized understanding of and has agreed to the management plan. Patient is aware to call the clinic if symptoms persist or worsen. Patient is aware when to return to the clinic for a follow-up visit. Patient educated on when it is appropriate to go to the emergency department.   Mary-Margaret Hassell Done, FNP

## 2020-01-07 ENCOUNTER — Other Ambulatory Visit: Payer: Self-pay | Admitting: *Deleted

## 2020-01-07 MED ORDER — FLUCONAZOLE 150 MG PO TABS: ORAL_TABLET | ORAL | 0 refills | Status: DC

## 2020-05-28 ENCOUNTER — Other Ambulatory Visit: Payer: 59

## 2020-05-28 ENCOUNTER — Other Ambulatory Visit: Payer: Self-pay | Admitting: Nurse Practitioner

## 2020-05-28 DIAGNOSIS — Z20822 Contact with and (suspected) exposure to covid-19: Secondary | ICD-10-CM

## 2020-05-31 LAB — NOVEL CORONAVIRUS, NAA: SARS-CoV-2, NAA: NOT DETECTED

## 2020-09-14 ENCOUNTER — Ambulatory Visit (INDEPENDENT_AMBULATORY_CARE_PROVIDER_SITE_OTHER): Payer: 59 | Admitting: Pharmacist

## 2020-09-14 ENCOUNTER — Encounter: Payer: Self-pay | Admitting: Pharmacist

## 2020-09-14 ENCOUNTER — Other Ambulatory Visit (HOSPITAL_COMMUNITY): Payer: Self-pay

## 2020-09-14 VITALS — Wt 230.0 lb

## 2020-09-14 DIAGNOSIS — E8881 Metabolic syndrome: Secondary | ICD-10-CM | POA: Diagnosis not present

## 2020-09-14 DIAGNOSIS — Z6835 Body mass index (BMI) 35.0-35.9, adult: Secondary | ICD-10-CM | POA: Diagnosis not present

## 2020-09-14 MED ORDER — OZEMPIC (0.25 OR 0.5 MG/DOSE) 2 MG/1.5ML ~~LOC~~ SOPN
PEN_INJECTOR | SUBCUTANEOUS | 5 refills | Status: DC
  Filled 2020-09-14: qty 1.5, 56d supply, fill #0
  Filled 2020-11-09: qty 3, 56d supply, fill #1

## 2020-09-14 NOTE — Progress Notes (Signed)
    09/14/2020 Name: NASIM GAROFANO MRN: [id number] DOB: 08-13-1980    S:  69 yoF presents forweight lossevaluation, education, and management Patient was referred and last seen by Primary Care Provider on10/25/21.She would like to lose weight and interested in [place]. She is currently 230lbs and is motivated to lose weight.  Her current BMI is in the obese category.   Patientreportsadherence with medications.  Currentmedications for weight loss:n/a ? Has tried diet ? Encouraged exercise  Patient is active during the day.   She reports she has difficulty with snacking/carbs/cravings.   O:   Current weight 230lbs  Lipid Panel     Component Value Date/Time   CHOL 135 09/10/2014 0936   TRIG 63.0 09/10/2014 0936   HDL 45.50 09/10/2014 0936   CHOLHDL 3 09/10/2014 0936   VLDL 12.6 09/10/2014 0936   LDLCALC 77 09/10/2014 0936     A/P:  -Healthy eating and meal planning discussed  -Will start Ozempic 0.25mg  sq weekly for 4 weeks, then increase to 0.5mg  sq weekly for 4 weeks(will titrate as needed).Will obtain medication via [place]. Patient denies history of thyroid/medullary cancer.  Written patient instructions provided. Total time in face to face counseling 58minutes.   Regina Eck, PharmD, BCPS Clinical Pharmacist, [place] [phone]

## 2020-11-09 ENCOUNTER — Other Ambulatory Visit (HOSPITAL_COMMUNITY): Payer: Self-pay

## 2020-12-06 ENCOUNTER — Other Ambulatory Visit: Payer: Self-pay | Admitting: Family Medicine

## 2020-12-06 MED ORDER — FLUCONAZOLE 150 MG PO TABS: ORAL_TABLET | ORAL | 0 refills | Status: DC

## 2021-01-26 DIAGNOSIS — Z01419 Encounter for gynecological examination (general) (routine) without abnormal findings: Secondary | ICD-10-CM | POA: Diagnosis not present

## 2021-01-26 DIAGNOSIS — N946 Dysmenorrhea, unspecified: Secondary | ICD-10-CM | POA: Diagnosis not present

## 2021-01-26 DIAGNOSIS — Z9851 Tubal ligation status: Secondary | ICD-10-CM | POA: Diagnosis not present

## 2021-01-26 DIAGNOSIS — D229 Melanocytic nevi, unspecified: Secondary | ICD-10-CM | POA: Diagnosis not present

## 2021-02-01 ENCOUNTER — Encounter: Payer: Self-pay | Admitting: Family Medicine

## 2021-02-01 ENCOUNTER — Ambulatory Visit: Payer: 59 | Admitting: Family Medicine

## 2021-02-01 DIAGNOSIS — R52 Pain, unspecified: Secondary | ICD-10-CM

## 2021-02-01 DIAGNOSIS — J029 Acute pharyngitis, unspecified: Secondary | ICD-10-CM

## 2021-02-01 LAB — CULTURE, GROUP A STREP

## 2021-02-01 LAB — VERITOR FLU A/B WAIVED
Influenza A: NEGATIVE
Influenza B: NEGATIVE

## 2021-02-01 LAB — RAPID STREP SCREEN (MED CTR MEBANE ONLY): Strep Gp A Ag, IA W/Reflex: NEGATIVE

## 2021-02-01 NOTE — Progress Notes (Signed)
Virtual Visit via telephone Note Due to COVID-19 pandemic this visit was conducted virtually. This visit type was conducted due to national recommendations for restrictions regarding the COVID-19 Pandemic (e.g. social distancing, sheltering in place) in an effort to limit this patient's exposure and mitigate transmission in our community. All issues noted in this document were discussed and addressed.  A physical exam was not performed with this format.   I connected with Molly Henson on 02/01/2021 at 1052 by telephone and verified that I am speaking with the correct person using two identifiers. Molly Henson is currently located at home and family is currently with them during visit. The provider, Monia Pouch, FNP is located in their office at time of visit.  I discussed the limitations, risks, security and privacy concerns of performing an evaluation and management service by telephone and the availability of in person appointments. I also discussed with the patient that there may be a patient responsible charge related to this service. The patient expressed understanding and agreed to proceed.  Subjective:  Patient ID: Molly Henson, female    DOB: 09-Feb-1981, 40 y.o.   MRN: [id number]  Chief Complaint:  Sore Throat and Generalized Body Aches   HPI: Molly Henson is a 40 y.o. female presenting on 02/01/2021 for Sore Throat and Generalized Body Aches   Pt reports sore throat, body aches, and low grade fever. Symptoms started on Sunday and are getting worse. She states her throat is red and swollen but denies blisters or white patches. Works in Corporate treasurer. She is going for COVID testing today. She has taken Tylenol with some relief of symptoms.   Sore Throat  This is a new problem. The current episode started yesterday. The problem has been gradually worsening. Neither side of throat is experiencing more pain than the other. The maximum temperature recorded prior to her arrival was  101 - 101.9 F. The pain is at a severity of 4/10. The pain is mild. Associated symptoms include congestion, coughing and trouble swallowing. Pertinent negatives include no abdominal pain, diarrhea, drooling, ear discharge, ear pain, headaches, hoarse voice, plugged ear sensation, neck pain, shortness of breath, stridor, swollen glands or vomiting. She has tried acetaminophen for the symptoms. The treatment provided mild relief.    Relevant past medical, surgical, family, and social history reviewed and updated as indicated.  Allergies and medications reviewed and updated.   Past Medical History:  Diagnosis Date   Allergy    Heart murmur    as a childd   Migraines    Seasonal allergies     Past Surgical History:  Procedure Laterality Date   CESAREAN SECTION  02/08/2004   CESAREAN SECTION  07/2008   CHOLECYSTECTOMY     ECTOPIC PREGNANCY SURGERY  11/2000   GALLBLADDER SURGERY  03/2004   TUBAL LIGATION  07/2008    Social History   Socioeconomic History   Marital status: Married    Spouse name: Not on file   Number of children: 2   Years of education: Not on file   Highest education level: Not on file  Occupational History   Occupation: LPN    Employer: [employer]  Tobacco Use   Smoking status: Never   Smokeless tobacco: Never  Substance and Sexual Activity   Alcohol use: No    Alcohol/week: 0.0 standard drinks   Drug use: No   Sexual activity: Yes    Birth control/protection: Surgical  Other Topics Concern   Not on  file  Social History Narrative   Molly Henson is divorced & remarried. She lives with her husband, 2 biological children & step-son.   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   Social Determinants of Health   Financial Resource Strain: Not on file  Food Insecurity: Not on file  Transportation Needs: Not on file  Physical Activity: Not on file  Stress: Not on file  Social Connections: Not on file  Intimate Partner Violence: Not on file     Outpatient Encounter Medications as of 02/01/2021  Medication Sig   fluconazole (DIFLUCAN) 150 MG tablet Take 1 dose today and repeat in 3 days.   fluconazole (DIFLUCAN) 150 MG tablet Take 1 tablet today and another tablet as needed   Semaglutide,0.25 or 0.5MG /DOS, (OZEMPIC, 0.25 OR 0.5 MG/DOSE,) 2 MG/1.5ML SOPN Inject 0.25mg  into the skin weekly for 4 weeks and then increase to 0.5mg  weekly   No facility-administered encounter medications on file as of 02/01/2021.    No Known Allergies  Review of Systems  Constitutional:  Negative for activity change, appetite change, chills, diaphoresis, fatigue, fever and unexpected weight change.  HENT:  Positive for congestion, sore throat and trouble swallowing. Negative for drooling, ear discharge, ear pain, hoarse voice and voice change.   Eyes: Negative.  Negative for photophobia and visual disturbance.  Respiratory:  Positive for cough. Negative for chest tightness, shortness of breath and stridor.   Cardiovascular:  Negative for chest pain, palpitations and leg swelling.  Gastrointestinal:  Negative for abdominal pain, blood in stool, constipation, diarrhea, nausea and vomiting.  Endocrine: Negative.  Negative for cold intolerance, heat intolerance, polydipsia, polyphagia and polyuria.  Genitourinary:  Negative for decreased urine volume, difficulty urinating, dysuria, frequency and urgency.  Musculoskeletal:  Positive for myalgias. Negative for arthralgias, back pain, gait problem, joint swelling and neck pain.  Skin: Negative.   Allergic/Immunologic: Negative.   Neurological:  Negative for dizziness, tremors, seizures, syncope, facial asymmetry, speech difficulty, weakness, light-headedness, numbness and headaches.  Hematological: Negative.   Psychiatric/Behavioral:  Negative for confusion, hallucinations, sleep disturbance and suicidal ideas.   All other systems reviewed and are negative.       Observations/Objective: No vital signs  or physical exam, this was a telephone or virtual health encounter.  Pt alert and oriented, answers all questions appropriately, and able to speak in full sentences.    Assessment and Plan: Tamikka was seen today for sore throat and generalized body aches.  Diagnoses and all orders for this visit:  Sore throat Body aches Labs pending, treatment pending results. She is going for COVID testing today. Symptomatic care discussed in detail. Report any new, worsening, or persistent symptoms.  -     Veritor Flu A/B Waived -     Rapid Strep Screen ([facility] ONLY)    Follow Up Instructions: Return if symptoms worsen or fail to improve.    I discussed the assessment and treatment plan with the patient. The patient was provided an opportunity to ask questions and all were answered. The patient agreed with the plan and demonstrated an understanding of the instructions.   The patient was advised to call back or seek an in-person evaluation if the symptoms worsen or if the condition fails to improve as anticipated.  The above assessment and management plan was discussed with the patient. The patient verbalized understanding of and has agreed to the management plan. Patient is aware to call the clinic if they develop any new symptoms  or if symptoms persist or worsen. Patient is aware when to return to the clinic for a follow-up visit. Patient educated on when it is appropriate to go to the emergency department.    I provided 11 minutes of non-face-to-face time during this encounter. The call started at 1052. The call ended at 1100. The other time was used for coordination of care.    Monia Pouch, FNP-C [facility] [address] [place], [place] [address] [phone] 02/01/2021

## 2021-02-03 ENCOUNTER — Other Ambulatory Visit: Payer: Self-pay | Admitting: Family Medicine

## 2021-02-03 ENCOUNTER — Other Ambulatory Visit: Payer: 59

## 2021-02-03 DIAGNOSIS — R52 Pain, unspecified: Secondary | ICD-10-CM | POA: Diagnosis not present

## 2021-02-03 DIAGNOSIS — J029 Acute pharyngitis, unspecified: Secondary | ICD-10-CM | POA: Diagnosis not present

## 2021-02-03 NOTE — Addendum Note (Signed)
Addended by: Baruch Gouty on: 02/03/2021 03:09 PM   Modules accepted: Orders

## 2021-02-03 NOTE — Addendum Note (Signed)
Addended by: Baruch Gouty on: 02/03/2021 03:03 PM   Modules accepted: Orders

## 2021-02-04 ENCOUNTER — Telehealth: Payer: Self-pay | Admitting: Family Medicine

## 2021-02-04 LAB — SARS-COV-2, NAA 2 DAY TAT

## 2021-02-04 LAB — NOVEL CORONAVIRUS, NAA: SARS-CoV-2, NAA: DETECTED — AB

## 2021-02-04 NOTE — Telephone Encounter (Signed)
Copy of labs printed for patient.

## 2021-02-04 NOTE — Telephone Encounter (Signed)
Patient is calling requesting COVID results. Patient aware that results are positive.

## 2021-03-28 ENCOUNTER — Other Ambulatory Visit: Payer: Self-pay | Admitting: Nurse Practitioner

## 2021-03-28 DIAGNOSIS — Z1231 Encounter for screening mammogram for malignant neoplasm of breast: Secondary | ICD-10-CM

## 2021-05-11 ENCOUNTER — Ambulatory Visit
Admission: RE | Admit: 2021-05-11 | Discharge: 2021-05-11 | Disposition: A | Discharge status: Home / Self Care | Payer: 59 | Source: Ambulatory Visit | Attending: Nurse Practitioner | Admitting: Nurse Practitioner

## 2021-05-11 DIAGNOSIS — Z1231 Encounter for screening mammogram for malignant neoplasm of breast: Secondary | ICD-10-CM

## 2021-05-15 DIAGNOSIS — N200 Calculus of kidney: Secondary | ICD-10-CM

## 2021-05-15 HISTORY — DX: Calculus of kidney: N20.0

## 2021-08-03 ENCOUNTER — Ambulatory Visit (INDEPENDENT_AMBULATORY_CARE_PROVIDER_SITE_OTHER): Payer: 59

## 2021-08-03 ENCOUNTER — Ambulatory Visit (INDEPENDENT_AMBULATORY_CARE_PROVIDER_SITE_OTHER): Payer: 59 | Admitting: Nurse Practitioner

## 2021-08-03 ENCOUNTER — Encounter: Payer: Self-pay | Admitting: Nurse Practitioner

## 2021-08-03 VITALS — BP 128/82 | HR 84 | Temp 97.4°F | Ht 67.0 in | Wt 230.0 lb

## 2021-08-03 DIAGNOSIS — M7989 Other specified soft tissue disorders: Secondary | ICD-10-CM | POA: Diagnosis not present

## 2021-08-03 DIAGNOSIS — M79641 Pain in right hand: Secondary | ICD-10-CM

## 2021-08-03 DIAGNOSIS — M67441 Ganglion, right hand: Secondary | ICD-10-CM | POA: Diagnosis not present

## 2021-08-03 NOTE — Patient Instructions (Signed)
Ganglion Cyst ?A ganglion cyst is a non-cancerous, fluid-filled lump of tissue that occurs near a joint, tendon, or ligament. The cyst grows out of a joint or the lining of a tendon or ligament. Ganglion cysts most often develop in the hand or wrist, but they can also develop in the shoulder, elbow, hip, knee, ankle, or foot. ?Ganglion cysts are ball-shaped or egg-shaped. Their size can range from the size of a pea to larger than a grape. Increased activity may cause the cyst to get bigger because more fluid starts to build up. ?What are the causes? ?The exact cause of this condition is not known, but it may be related to: ?Inflammation or irritation around the joint. ?An injury or tear in the layers of tissue around the joint (joint capsule). ?Repetitive movements or overuse. ?History of acute or repeated injury. ?What increases the risk? ?You are more likely to develop this condition if: ?You are a female. ?You are 62-21 years old. ?What are the signs or symptoms? ?The main symptom of this condition is a lump. It most often appears on the hand or wrist. In many cases, there are no other symptoms, but a cyst can sometimes cause: ?Tingling. ?Pain or tenderness. ?Numbness. ?Weakness or loss of strength in the affected joint. ?Decreased range of motion in the affected area of the body. ?How is this diagnosed? ?Ganglion cysts are usually diagnosed based on a physical exam. Your health care provider will feel the lump and may shine a light next to it. If it is a ganglion cyst, the light will likely shine through it. ?Your health care provider may order an X-ray, ultrasound, MRI, or CT scan to rule out other conditions. ?How is this treated? ?Ganglion cysts often go away on their own without treatment. If you have pain or other symptoms, treatment may be needed. Treatment is also needed if the ganglion cyst limits your movement or if it gets infected. Treatment may include: ?Wearing a brace or splint on your wrist or  finger. ?Taking anti-inflammatory medicine. ?Having fluid drained from the lump with a needle (aspiration). ?Getting an injection of medicine into the joint to decrease inflammation. This may be corticosteroids, ethanol, or hyaluronidase. ?Having surgery to remove the ganglion cyst. ?Placing a pad in your shoe or wearing shoes that will not rub against the cyst if it is on your foot. ?Follow these instructions at home: ?Do not press on the ganglion cyst, poke it with a needle, or hit it. ?Take over-the-counter and prescription medicines only as told by your health care provider. ?If you have a brace or splint: ?Wear it as told by your health care provider. ?Remove it as told by your health care provider. Ask if you need to remove it when you take a shower or a bath. ?Watch your ganglion cyst for any changes. ?Keep all follow-up visits as told by your health care provider. This is important. ?Contact a health care provider if: ?Your ganglion cyst becomes larger or more painful. ?You have pus coming from the lump. ?You have weakness or numbness in the affected area. ?You have a fever or chills. ?Get help right away if: ?You have a fever and have any of these in the cyst area: ?Increased redness. ?Red streaks. ?Swelling. ?Summary ?A ganglion cyst is a non-cancerous, fluid-filled lump that occurs near a joint, tendon, or ligament. ?Ganglion cysts most often develop in the hand or wrist, but they can also develop in the shoulder, elbow, hip, knee, ankle, or foot. ?  Ganglion cysts often go away on their own without treatment. ?This information is not intended to replace advice given to you by your health care provider. Make sure you discuss any questions you have with your health care provider. ?Document Revised: 07/23/2019 Document Reviewed: 07/23/2019 ?Elsevier Patient Education ? [place]. ? ?

## 2021-08-03 NOTE — Progress Notes (Signed)
? ?  Subjective:  ? ? Patient ID: [employer], female    DOB: 12-Mar-1981, 41 y.o.   MRN: [id number] ? ? ?Chief Complaint: hand pain ? ?HPI ? ?Patient comes in c/o a tender nodule in between the base of her right 4th wand fifth finger. She noticed it a few days ago. Says is very sore to the touch. Sh ehas not taken anythng for it at home. ? ? ?Review of Systems  ?Constitutional:  Negative for diaphoresis.  ?Eyes:  Negative for pain.  ?Respiratory:  Negative for shortness of breath.   ?Cardiovascular:  Negative for chest pain, palpitations and leg swelling.  ?Gastrointestinal:  Negative for abdominal pain.  ?Endocrine: Negative for polydipsia.  ?Skin:  Negative for rash.  ?Neurological:  Negative for dizziness, weakness and headaches.  ?Hematological:  Does not bruise/bleed easily.  ?All other systems reviewed and are negative. ? ?   ?Objective:  ? Physical Exam ?Constitutional:   ?   Appearance: Normal appearance.  ?Cardiovascular:  ?   Rate and Rhythm: Normal rate and regular rhythm.  ?   Heart sounds: Normal heart sounds.  ?Pulmonary:  ?   Effort: Pulmonary effort is normal.  ?   Breath sounds: Normal breath sounds.  ?Musculoskeletal:  ?   Comments: 2cm tender hard nodule between the 4th and 5th metacarpals of right hand.  ?Skin: ?   General: Skin is warm and dry.  ?Neurological:  ?   General: No focal deficit present.  ?   Mental Status: She is alert and oriented to person, place, and time.  ? ? ?BP 128/82   Pulse 84   Temp (!) 97.4 ?F (36.3 ?C) (Skin)   Ht '5\' 7"'$  (1.702 m)   Wt 230 lb (104.3 kg)   BMI 36.02 kg/m?  ? ?Hand xray normal-Preliminary reading by Ronnald Collum, FNP  [facility] ? ? ? ?   ?Assessment & Plan:  ? ?Rolena Infante in today with chief complaint of No chief complaint on file. ? ? ?1. Pain of right hand ?- DG Hand Complete Right ? ?2. Ganglion cyst of finger of right hand ?Motrin as needed ?Soak hand in warm epsom salt ?RTO prn ? ? ? ?The above assessment and management plan was discussed with the  patient. The patient verbalized understanding of and has agreed to the management plan. Patient is aware to call the clinic if symptoms persist or worsen. Patient is aware when to return to the clinic for a follow-up visit. Patient educated on when it is appropriate to go to the emergency department.  ? ?Mary-Margaret Hassell Done, FNP ? ? ?

## 2021-10-20 ENCOUNTER — Other Ambulatory Visit: Payer: 59

## 2021-10-20 DIAGNOSIS — R399 Unspecified symptoms and signs involving the genitourinary system: Secondary | ICD-10-CM

## 2021-10-20 LAB — URINALYSIS, COMPLETE
Bilirubin, UA: NEGATIVE
Glucose, UA: NEGATIVE
Ketones, UA: NEGATIVE
Leukocytes,UA: NEGATIVE
Nitrite, UA: NEGATIVE
Protein,UA: NEGATIVE
Specific Gravity, UA: >1.03 — ABNORMAL HIGH (ref 1.005–1.030)
Urobilinogen, Ur: 0.2 mg/dL (ref 0.2–1.0)
pH, UA: 5 (ref 5.0–7.5)

## 2021-10-20 LAB — MICROSCOPIC EXAMINATION: Epithelial Cells (non renal): >10 /hpf — ABNORMAL HIGH (ref 0–10)

## 2021-12-29 ENCOUNTER — Other Ambulatory Visit: Payer: 59

## 2021-12-30 ENCOUNTER — Ambulatory Visit (INDEPENDENT_AMBULATORY_CARE_PROVIDER_SITE_OTHER): Payer: 59 | Admitting: Nurse Practitioner

## 2021-12-30 ENCOUNTER — Ambulatory Visit
Admission: RE | Admit: 2021-12-30 | Discharge: 2021-12-30 | Disposition: A | Discharge status: Home / Self Care | Payer: 59 | Source: Ambulatory Visit | Attending: Nurse Practitioner | Admitting: Nurse Practitioner

## 2021-12-30 ENCOUNTER — Ambulatory Visit (INDEPENDENT_AMBULATORY_CARE_PROVIDER_SITE_OTHER): Payer: 59

## 2021-12-30 ENCOUNTER — Encounter: Payer: Self-pay | Admitting: Nurse Practitioner

## 2021-12-30 VITALS — BP 99/63 | HR 67 | Temp 97.2°F | Resp 20 | Ht 67.0 in | Wt 230.0 lb

## 2021-12-30 DIAGNOSIS — R1032 Left lower quadrant pain: Secondary | ICD-10-CM | POA: Insufficient documentation

## 2021-12-30 DIAGNOSIS — R109 Unspecified abdominal pain: Secondary | ICD-10-CM

## 2021-12-30 MED ORDER — IOHEXOL 300 MG/ML  SOLN
100.0000 mL | Freq: Once | INTRAMUSCULAR | Status: AC | PRN
  Administered 2021-12-30: 100 mL via INTRAVENOUS

## 2021-12-30 NOTE — Progress Notes (Signed)
   Subjective:    Patient ID: [employer], female    DOB: 01-28-1981, 41 y.o.   MRN: [id number]   Chief Complaint: Abdominal Pain   Abdominal Pain This is a new problem. The current episode started 1 to 4 weeks ago. The onset quality is gradual. The problem occurs intermittently. The problem has been waxing and waning. The pain is located in the LLQ. The pain is at a severity of 7/10. The quality of the pain is aching and sharp (intermittent). The abdominal pain does not radiate. Associated symptoms include constipation. Pertinent negatives include no diarrhea, nausea or vomiting. Nothing aggravates the pain. The pain is relieved by Nothing. She has tried nothing for the symptoms.       Review of Systems  Gastrointestinal:  Positive for abdominal pain and constipation. Negative for diarrhea, nausea and vomiting.  Genitourinary: Negative.   Psychiatric/Behavioral: Negative.    All other systems reviewed and are negative.      Objective:   Physical Exam Constitutional:      Appearance: She is well-developed. She is obese.  Cardiovascular:     Rate and Rhythm: Normal rate and regular rhythm.     Heart sounds: Normal heart sounds.  Pulmonary:     Effort: Pulmonary effort is normal.     Breath sounds: Normal breath sounds.  Abdominal:     General: Bowel sounds are normal.     Palpations: Abdomen is soft.     Tenderness: There is abdominal tenderness (llq pain).  Skin:    General: Skin is warm.  Neurological:     General: No focal deficit present.     Mental Status: She is alert.  Psychiatric:        Mood and Affect: Mood normal.        Behavior: Behavior normal.       KUB- abscence of gas pattern descending Kennedy Bucker, FNP     Assessment & Plan:   [employer] in today with chief complaint of Abdominal Pain   1. Abdominal pain, unspecified abdominal location - DG Abd 1 View; Future  2. Left lower quadrant abdominal pain Will talk once ct  results are back - CT Abdomen Pelvis W Contrast; Future    The above assessment and management plan was discussed with the patient. The patient verbalized understanding of and has agreed to the management plan. Patient is aware to call the clinic if symptoms persist or worsen. Patient is aware when to return to the clinic for a follow-up visit. Patient educated on when it is appropriate to go to the emergency department.   Mary-Margaret Hassell Done, FNP

## 2022-01-10 ENCOUNTER — Other Ambulatory Visit: Payer: Self-pay | Admitting: Nurse Practitioner

## 2022-01-10 DIAGNOSIS — K921 Melena: Secondary | ICD-10-CM

## 2022-05-16 ENCOUNTER — Encounter: Payer: Self-pay | Admitting: Internal Medicine

## 2022-05-18 ENCOUNTER — Other Ambulatory Visit: Payer: Self-pay | Admitting: Nurse Practitioner

## 2022-05-18 DIAGNOSIS — Z1231 Encounter for screening mammogram for malignant neoplasm of breast: Secondary | ICD-10-CM

## 2022-05-22 ENCOUNTER — Ambulatory Visit
Admission: RE | Admit: 2022-05-22 | Discharge: 2022-05-22 | Disposition: A | Discharge status: Home / Self Care | Payer: Commercial Managed Care - PPO | Source: Ambulatory Visit | Attending: Nurse Practitioner | Admitting: Nurse Practitioner

## 2022-05-22 DIAGNOSIS — Z1231 Encounter for screening mammogram for malignant neoplasm of breast: Secondary | ICD-10-CM

## 2022-06-26 ENCOUNTER — Encounter: Payer: Self-pay | Admitting: *Deleted

## 2022-07-26 ENCOUNTER — Ambulatory Visit (INDEPENDENT_AMBULATORY_CARE_PROVIDER_SITE_OTHER): Payer: Commercial Managed Care - PPO | Admitting: Internal Medicine

## 2022-07-26 ENCOUNTER — Encounter: Payer: Self-pay | Admitting: Internal Medicine

## 2022-07-26 VITALS — BP 118/80 | HR 92 | Ht 66.0 in | Wt 238.1 lb

## 2022-07-26 DIAGNOSIS — R198 Other specified symptoms and signs involving the digestive system and abdomen: Secondary | ICD-10-CM | POA: Diagnosis not present

## 2022-07-26 DIAGNOSIS — R194 Change in bowel habit: Secondary | ICD-10-CM | POA: Diagnosis not present

## 2022-07-26 DIAGNOSIS — K625 Hemorrhage of anus and rectum: Secondary | ICD-10-CM

## 2022-07-26 MED ORDER — NA SULFATE-K SULFATE-MG SULF 17.5-3.13-1.6 GM/177ML PO SOLN
1.0000 | ORAL | 0 refills | Status: DC
Start: 2022-07-26 — End: 2023-01-11

## 2022-07-26 NOTE — Patient Instructions (Addendum)
_______________________________________________________  If your blood pressure at your visit was 140/90 or greater, please contact your primary care physician to follow up on this.  If you are age 44 or younger, your body mass index should be between 19-25. Your Body mass index is 38.43 kg/m. If this is out of the aformentioned range listed, please consider follow up with your Primary Care Provider.  ________________________________________________________  The [facility] providers would like to encourage you to use [facility] to communicate with providers for non-urgent requests or questions.  Due to long hold times on the telephone, sending your provider a message by [facility] may be a faster and more efficient way to get a response.  Please allow 48 business hours for a response.  Please remember that this is for non-urgent requests.  _______________________________________________________  Dennis Bast have been scheduled for a colonoscopy. Please follow written instructions given to you at your visit today.  Please pick up your prep supplies at the pharmacy within the next 1-3 days. If you use inhalers (even only as needed), please bring them with you on the day of your procedure.  Due to recent changes in healthcare laws, you may see the results of your imaging and laboratory studies on MyChart before your provider has had a chance to review them.  We understand that in some cases there may be results that are confusing or concerning to you. Not all laboratory results come back in the same time frame and the provider may be waiting for multiple results in order to interpret others.  Please give [place] 48 hours in order for your provider to thoroughly review all the results before contacting the office for clarification of your results.   Thank you for entrusting me with your care and choosing [facility].  Dr Hilarie Fredrickson

## 2022-07-26 NOTE — Progress Notes (Signed)
Patient ID: Molly Henson, female   DOB: 08-06-1980, 42 y.o.   MRN: [id number] HPI: Molly Henson is a 42 year old female with a past medical history of chronic constipation, gallstones status postcholecystectomy in 2005, seasonal allergies, migraines and asymptomatic kidney stones who is seen to evaluate change in bowel habits and rectal bleeding.  She is here alone today.  She reports that she has had lifelong constipation though since her cholecystectomy she has intermittent diarrhea particularly with certain foods.  This can be urgent and postprandial.  She has with hard stools had intermittent blood with wiping but in the last few months she has noticed blood on a more regular basis after bowel movement with wiping.  This can even occur now if the stools are soft or loose.  This is a change for her.  She is also feeling more bloating and in general abdominal discomfort.  She does not have melena.  She does not take any medications for her constipation or for loose stool.  She has occasional reflux and some trouble swallowing pills.  No regular solid or liquid dysphagia.  No nausea or vomiting.  No unintentional weight loss.  No history of asthma though she does have seasonal allergies.  She works as a Marine scientist at [employer]  She is married.  She has 2 teenage children.  Past Medical History:  Diagnosis Date   Allergy    Gallstones    Heart murmur    as a childd   Kidney stones    Migraines    Seasonal allergies     Past Surgical History:  Procedure Laterality Date   CESAREAN SECTION  02/08/2004   CESAREAN SECTION  07/13/2008   CHOLECYSTECTOMY  03/15/2004   ECTOPIC PREGNANCY SURGERY  11/12/2000   TUBAL LIGATION  07/13/2008    Outpatient Medications Prior to Visit  Medication Sig Dispense Refill   VITAMIN D PO Take by mouth.     No facility-administered medications prior to visit.    No Known Allergies  Family History  Problem Relation Age of Onset    Hyperlipidemia Mother    Thyroid disease Mother        Goiter   Atrial fibrillation Father    Hyperlipidemia Sister    Alopecia Sister    Thyroid cancer Maternal Grandmother    Heart disease Maternal Grandfather        premature   Liver disease Paternal Grandmother    Aneurysm Paternal Grandfather        brain   Migraines Daughter    Breast cancer Neg Hx     Social History   Tobacco Use   Smoking status: Never   Smokeless tobacco: Never  Vaping Use   Vaping Use: Never used  Substance Use Topics   Alcohol use: No    Alcohol/week: 0.0 standard drinks of alcohol   Drug use: No    ROS: As per history of present illness, otherwise negative  BP 118/80 (BP Location: Left Arm, Patient Position: Sitting, Cuff Size: Large)   Pulse 92   Ht '5\' 6"'$  (1.676 m) Comment: height measured without shoes  Wt 238 lb 2 oz (108 kg)   LMP 07/17/2022   BMI 38.43 kg/m  Gen: awake, alert, NAD HEENT: anicteric  Abd: Mild discomfort to palpation in the mid and lower abdomen without rebound or guarding, soft and nondistended, +BS throughout Ext: no c/c/e Neuro: nonfocal   RELEVANT LABS AND IMAGING: CBC    Component Value Date/Time  WBC 7.1 01/08/2018 1028   WBC 6.6 09/10/2014 0936   RBC 4.68 01/08/2018 1028   RBC 4.84 09/10/2014 0936   HGB 13.8 01/08/2018 1028   HCT 41.8 01/08/2018 1028   PLT 269 01/08/2018 1028   MCV 89 01/08/2018 1028   MCH 29.5 01/08/2018 1028   MCHC 33.0 01/08/2018 1028   MCHC 33.7 09/10/2014 0936   RDW 12.5 01/08/2018 1028   LYMPHSABS 2.1 01/08/2018 1028   MONOABS 0.5 09/10/2014 0936   EOSABS 0.2 01/08/2018 1028   BASOSABS 0.0 01/08/2018 1028    CMP     Component Value Date/Time   NA 136 09/10/2014 0936   K 4.3 09/10/2014 0936   CL 104 09/10/2014 0936   CO2 29 09/10/2014 0936   GLUCOSE 91 09/10/2014 0936   BUN 8 09/10/2014 0936   CREATININE 0.59 09/10/2014 0936   CALCIUM 9.3 09/10/2014 0936   PROT 7.0 09/10/2014 0936   ALBUMIN 4.2 09/10/2014 0936    AST 14 09/10/2014 0936   ALT 11 09/10/2014 0936   ALKPHOS 58 09/10/2014 0936   BILITOT 0.9 09/10/2014 0936   CT ABDOMEN AND PELVIS WITH CONTRAST   TECHNIQUE: Multidetector CT imaging of the abdomen and pelvis was performed using the standard protocol following bolus administration of intravenous contrast.   RADIATION DOSE REDUCTION: This exam was performed according to the departmental dose-optimization program which includes automated exposure control, adjustment of the mA and/or kV according to patient size and/or use of iterative reconstruction technique.   CONTRAST:  145m OMNIPAQUE IOHEXOL 300 MG/ML  SOLN   COMPARISON:  None Available.   FINDINGS: Lower chest: No acute abnormality.   Hepatobiliary: No focal liver abnormality is seen. Status post cholecystectomy. No biliary dilatation.   Pancreas: Unremarkable. No pancreatic ductal dilatation or surrounding inflammatory changes.   Spleen: Normal in size without focal abnormality.   Adrenals/Urinary Tract: Adrenal glands are unremarkable. 2 mm nonobstructing calculus in the upper pole of the right kidney and interpolar region of the left kidney. No evidence of hydronephrosis or ureteral calculus. Urinary bladder is unremarkable.   Stomach/Bowel: Stomach is within normal limits. Appendix appears normal. No evidence of bowel wall thickening, distention, or inflammatory changes.   Vascular/Lymphatic: No significant vascular findings are present. No enlarged abdominal or pelvic lymph nodes.   Reproductive: Uterus and bilateral adnexa are unremarkable.   Other: No abdominal wall hernia or abnormality. No abdominopelvic ascites.   Musculoskeletal: No acute or significant osseous findings.   IMPRESSION: 1. Bilateral 2 mm non obstructing renal calculi without evidence of hydronephrosis or ureteral calculus.   2. Bowel loops are normal in caliber. No evidence of appendicitis, colitis or diverticulitis.   3.   Status post cholecystectomy.   4.  Uterus and adnexa are unremarkable.     Electronically Signed   By: IKeane PoliceD.O.   On: 12/30/2021 16:00  ASSESSMENT/PLAN: 42year old female with a past medical history of chronic constipation, gallstones status postcholecystectomy in 2005, seasonal allergies, migraines and asymptomatic kidney stones who is seen to evaluate change in bowel habits and rectal bleeding.   Change in bowel habits with predominantly constipation/intermittent rectal bleeding --colonoscopy is recommended.  Assuming that this is not inflammatory or neoplastic, which are felt unlikely, this may be irritable bowel with symptomatic hemorrhoids.  We can discuss therapy once we exclude luminal pathology.  She may benefit from Metamucil but again proceed to colonoscopy first [facility] Consideration of fiber versus additional diagnostic evaluation such as  excluding SIBO depending on results  2.  Intermittent GERD and pill dysphagia --she is not having significant dysphagia and does not wish to pursue this evaluation at this time.  It sounds like her reflux is rather rare.  She agreed to let me know if the symptom becomes more problematic over time      [id number], [place], [place],  [place] [id number]

## 2022-08-01 ENCOUNTER — Other Ambulatory Visit: Payer: Self-pay | Admitting: Nurse Practitioner

## 2022-08-01 ENCOUNTER — Other Ambulatory Visit: Payer: Commercial Managed Care - PPO

## 2022-08-01 DIAGNOSIS — R5383 Other fatigue: Secondary | ICD-10-CM

## 2022-08-01 LAB — LIPID PANEL

## 2022-08-02 LAB — CBC WITH DIFFERENTIAL/PLATELET
Basophils Absolute: 0 10*3/uL (ref 0.0–0.2)
Basos: 1 %
EOS (ABSOLUTE): 0.2 10*3/uL (ref 0.0–0.4)
Eos: 2 %
Hematocrit: 43.6 % (ref 34.0–46.6)
Hemoglobin: 13.9 g/dL (ref 11.1–15.9)
Immature Grans (Abs): 0 10*3/uL (ref 0.0–0.1)
Immature Granulocytes: 0 %
Lymphocytes Absolute: 2.2 10*3/uL (ref 0.7–3.1)
Lymphs: 30 %
MCH: 29.3 pg (ref 26.6–33.0)
MCHC: 31.9 g/dL (ref 31.5–35.7)
MCV: 92 fL (ref 79–97)
Monocytes Absolute: 0.7 10*3/uL (ref 0.1–0.9)
Monocytes: 9 %
Neutrophils Absolute: 4.3 10*3/uL (ref 1.4–7.0)
Neutrophils: 58 %
Platelets: 276 10*3/uL (ref 150–450)
RBC: 4.74 x10E6/uL (ref 3.77–5.28)
RDW: 11.8 % (ref 11.7–15.4)
WBC: 7.5 10*3/uL (ref 3.4–10.8)

## 2022-08-02 LAB — CMP14+EGFR
ALT: 17 IU/L (ref 0–32)
AST: 21 IU/L (ref 0–40)
Albumin/Globulin Ratio: 1.5 (ref 1.2–2.2)
Albumin: 3.9 g/dL (ref 3.9–4.9)
Alkaline Phosphatase: 70 IU/L (ref 44–121)
BUN/Creatinine Ratio: 11 (ref 9–23)
BUN: 8 mg/dL (ref 6–24)
Bilirubin Total: 0.7 mg/dL (ref 0.0–1.2)
CO2: 22 mmol/L (ref 20–29)
Calcium: 9.4 mg/dL (ref 8.7–10.2)
Chloride: 104 mmol/L (ref 96–106)
Creatinine, Ser: 0.72 mg/dL (ref 0.57–1.00)
Globulin, Total: 2.6 g/dL (ref 1.5–4.5)
Glucose: 86 mg/dL (ref 70–99)
Potassium: 4.4 mmol/L (ref 3.5–5.2)
Sodium: 140 mmol/L (ref 134–144)
Total Protein: 6.5 g/dL (ref 6.0–8.5)
eGFR: 106 mL/min/{1.73_m2} (ref >59)

## 2022-08-02 LAB — VITAMIN B12: Vitamin B-12: 455 pg/mL (ref 232–1245)

## 2022-08-02 LAB — LIPID PANEL
Chol/HDL Ratio: 3.3 ratio (ref 0.0–4.4)
Cholesterol, Total: 150 mg/dL (ref 100–199)
HDL: 46 mg/dL (ref >39)
LDL Chol Calc (NIH): 88 mg/dL (ref 0–99)
Triglycerides: 83 mg/dL (ref 0–149)
VLDL Cholesterol Cal: 16 mg/dL (ref 5–40)

## 2022-08-02 LAB — THYROID PANEL WITH TSH
Free Thyroxine Index: 2.2 (ref 1.2–4.9)
T3 Uptake Ratio: 28 % (ref 24–39)
T4, Total: 7.7 ug/dL (ref 4.5–12.0)
TSH: 1.12 u[IU]/mL (ref 0.450–4.500)

## 2022-08-02 LAB — VITAMIN D 25 HYDROXY (VIT D DEFICIENCY, FRACTURES): Vit D, 25-Hydroxy: 29.7 ng/mL — ABNORMAL LOW (ref 30.0–100.0)

## 2022-08-31 ENCOUNTER — Encounter: Payer: Self-pay | Admitting: Internal Medicine

## 2022-09-08 ENCOUNTER — Encounter: Payer: Commercial Managed Care - PPO | Admitting: Internal Medicine

## 2022-09-08 ENCOUNTER — Telehealth: Payer: Self-pay | Admitting: Internal Medicine

## 2022-09-08 NOTE — Telephone Encounter (Signed)
Patient called to cancel her procedure for today stated she has like a stomach bug and has not been able to keep the prep in so she rather postpone.

## 2022-09-08 NOTE — Telephone Encounter (Signed)
Noted  

## 2022-09-08 NOTE — Telephone Encounter (Signed)
FYI

## 2022-09-26 ENCOUNTER — Other Ambulatory Visit: Payer: Self-pay | Admitting: Family Medicine

## 2022-09-26 ENCOUNTER — Other Ambulatory Visit (HOSPITAL_COMMUNITY): Payer: Self-pay

## 2022-09-26 MED ORDER — VITAMIN D (ERGOCALCIFEROL) 1.25 MG (50000 UNIT) PO CAPS
50000.0000 [IU] | ORAL_CAPSULE | ORAL | 3 refills | Status: DC
  Filled 2022-09-26: qty 12, 84d supply, fill #0

## 2022-09-27 ENCOUNTER — Other Ambulatory Visit: Payer: Self-pay

## 2022-09-27 ENCOUNTER — Encounter: Payer: Self-pay | Admitting: Pharmacist

## 2022-10-03 ENCOUNTER — Other Ambulatory Visit: Payer: Self-pay

## 2023-01-11 ENCOUNTER — Encounter: Payer: Self-pay | Admitting: Family

## 2023-01-11 ENCOUNTER — Ambulatory Visit (INDEPENDENT_AMBULATORY_CARE_PROVIDER_SITE_OTHER): Payer: Commercial Managed Care - PPO | Admitting: Family

## 2023-01-11 VITALS — BP 105/63 | HR 79 | Temp 97.9°F | Ht 66.0 in | Wt 237.0 lb

## 2023-01-11 DIAGNOSIS — L812 Freckles: Secondary | ICD-10-CM

## 2023-01-11 DIAGNOSIS — E6609 Other obesity due to excess calories: Secondary | ICD-10-CM | POA: Diagnosis not present

## 2023-01-11 DIAGNOSIS — Z6838 Body mass index (BMI) 38.0-38.9, adult: Secondary | ICD-10-CM | POA: Diagnosis not present

## 2023-01-11 DIAGNOSIS — Z0000 Encounter for general adult medical examination without abnormal findings: Secondary | ICD-10-CM

## 2023-01-11 DIAGNOSIS — Z0001 Encounter for general adult medical examination with abnormal findings: Secondary | ICD-10-CM | POA: Diagnosis not present

## 2023-01-11 NOTE — Patient Instructions (Signed)

## 2023-01-11 NOTE — Progress Notes (Signed)
Subjective:    Patient ID: Molly Henson, female    DOB: April 26, 1981, 42 y.o.   MRN: [id number]  Chief Complaint  Patient presents with   Annual Exam    HPI PT presents to the office today for CPE without pap. She is followed by GYN annually. She is requesting a referral to dermatologists. She has several freckles and fair skin and wants her skin checked. Pt denies any headache, palpitations, SOB, or edema at this time.     Review of Systems  All other systems reviewed and are negative.  Family History  Problem Relation Age of Onset   Hyperlipidemia Mother    Thyroid disease Mother        Goiter   Atrial fibrillation Father    Hyperlipidemia Sister    Alopecia Sister    Thyroid cancer Maternal Grandmother    Heart disease Maternal Grandfather        premature   Liver disease Paternal Grandmother    Aneurysm Paternal Grandfather        brain   Migraines Daughter    Breast cancer Neg Hx    Social History   Socioeconomic History   Marital status: Married    Spouse name: Not on file   Number of children: 2   Years of education: Not on file   Highest education level: Not on file  Occupational History   Occupation: LPN    Employer: [employer]  Tobacco Use   Smoking status: Never   Smokeless tobacco: Never  Vaping Use   Vaping status: Never Used  Substance and Sexual Activity   Alcohol use: No    Alcohol/week: 0.0 standard drinks of alcohol   Drug use: No   Sexual activity: Yes    Birth control/protection: Surgical  Other Topics Concern   Not on file  Social History Narrative   Ms Schoendorf is divorced & remarried. She lives with her husband, 2 biological children & step-son.   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   Social Determinants of Health   Financial Resource Strain: Not on file  Food Insecurity: Not on file  Transportation Needs: Not on file  Physical Activity: Not on file  Stress: Not on file  Social Connections: Not on  file       Objective:   Physical Exam Vitals reviewed.  Constitutional:      General: She is not in acute distress.    Appearance: She is well-developed. She is obese.  HENT:     Head: Normocephalic and atraumatic.     Right Ear: Tympanic membrane normal.     Left Ear: Tympanic membrane normal.  Eyes:     Pupils: Pupils are equal, round, and reactive to light.  Neck:     Thyroid: No thyromegaly.  Cardiovascular:     Rate and Rhythm: Normal rate and regular rhythm.     Heart sounds: Normal heart sounds. No murmur heard. Pulmonary:     Effort: Pulmonary effort is normal. No respiratory distress.     Breath sounds: Normal breath sounds. No wheezing.  Abdominal:     General: Bowel sounds are normal. There is no distension.     Palpations: Abdomen is soft.     Tenderness: There is no abdominal tenderness.  Musculoskeletal:        General: No tenderness. Normal range of motion.     Cervical back: Normal range of motion and neck supple.  Skin:  General: Skin is warm and dry.  Neurological:     Mental Status: She is alert and oriented to person, place, and time.     Cranial Nerves: No cranial nerve deficit.     Deep Tendon Reflexes: Reflexes are normal and symmetric.  Psychiatric:        Behavior: Behavior normal.        Thought Content: Thought content normal.        Judgment: Judgment normal.      BP 105/63   Pulse 79   Temp 97.9 F (36.6 C) (Temporal)   Ht 5\' 6"  (1.676 m)   Wt 237 lb (107.5 kg)   BMI 38.25 kg/m        Assessment & Plan:  DANAYLA KULT comes in today with chief complaint of Annual Exam   Diagnosis and orders addressed:  1. Annual physical exam  2. Freckles - Ambulatory referral to Dermatology  3. Class 2 obesity due to excess calories without serious comorbidity with body mass index (BMI) of 38.0 to 38.9 in adult   Labs reviewed from several months. All normal except Vit D slightly low.  Health Maintenance reviewed Diet and  exercise encouraged  Follow up plan: 1 year   Jannifer Rodney, FNP

## 2023-02-23 ENCOUNTER — Telehealth: Payer: Self-pay | Admitting: Family

## 2023-02-23 NOTE — Telephone Encounter (Signed)
Patient is checking on the status of her dermatology referral. We see the referral was placed but do not see any notes about scheduling. Please review

## 2023-02-26 NOTE — Telephone Encounter (Signed)
PT will call dermatologists office.

## 2023-03-07 ENCOUNTER — Encounter: Payer: Self-pay | Admitting: Internal Medicine

## 2023-04-05 ENCOUNTER — Other Ambulatory Visit: Payer: Self-pay

## 2023-04-05 ENCOUNTER — Other Ambulatory Visit: Payer: Commercial Managed Care - PPO

## 2023-04-05 DIAGNOSIS — M2550 Pain in unspecified joint: Secondary | ICD-10-CM

## 2023-05-02 ENCOUNTER — Telehealth: Payer: Self-pay

## 2023-05-02 ENCOUNTER — Ambulatory Visit (AMBULATORY_SURGERY_CENTER): Payer: Commercial Managed Care - PPO

## 2023-05-02 VITALS — Ht 66.0 in | Wt 235.0 lb

## 2023-05-02 DIAGNOSIS — K625 Hemorrhage of anus and rectum: Secondary | ICD-10-CM

## 2023-05-02 MED ORDER — ONDANSETRON HCL 4 MG PO TABS: 4.0000 mg | ORAL_TABLET | Freq: Three times a day (TID) | ORAL | 1 refills | Status: DC | PRN

## 2023-05-02 MED ORDER — SUTAB 1479-225-188 MG PO TABS: 24.0000 | ORAL_TABLET | Freq: Once | ORAL | 0 refills | Status: AC

## 2023-05-02 NOTE — Addendum Note (Signed)
Addended by: Dallie Piles on: 05/02/2023 02:41 PM   Modules accepted: Orders

## 2023-05-02 NOTE — Telephone Encounter (Signed)
Fine for sutab I would use zofran ODT 4 mg 1 hour before each 1/2 of the sutab prep JMP

## 2023-05-02 NOTE — Progress Notes (Signed)
No egg or soy allergy known to patient  No issues known to pt with past sedation with any surgeries or procedures Patient denies ever being told they had issues or difficulty with intubation  No FH of Malignant Hyperthermia Pt is not on diet pills Pt is not on  home 02  Pt is not on blood thinners  Pt states issues with chronic constipation  No A fib or A flutter Have any cardiac testing pending--no Patient's chart reviewed by Cathlyn Parsons CNRA prior to previsit and patient appropriate for the [facility].  Previsit completed and red dot placed by patient's name on their procedure day (on provider's schedule).   Pt instructed to use Singlecare.com or GoodRx for a price reduction on prep  Ambulates independently

## 2023-05-02 NOTE — Telephone Encounter (Signed)
When RN was talking with patient in pre-visit for upcoming colonoscopy on 05/23/23, patient stated she had tried to drink Suprep for a colonoscopy in April but was unable to tolerate it, so she cancelled procedure. Patient is requesting Sutab instead.   Pre-visit RN to contact Dr. Rhea Belton to ask for approval for patient to use Sutab in place of Suprep. Pre-visit RN reviewed all required medical information with patient and scheduled a follow up pre-visit for 12/27 after hearing from Dr. Rhea Belton. Patient will only need prep instructions at that pre-visit.

## 2023-05-21 ENCOUNTER — Encounter: Payer: Self-pay | Admitting: Certified Registered Nurse Anesthetist

## 2023-05-23 ENCOUNTER — Encounter: Payer: Commercial Managed Care - PPO | Admitting: Internal Medicine

## 2023-05-23 ENCOUNTER — Telehealth: Payer: Self-pay | Admitting: Internal Medicine

## 2023-05-23 NOTE — Telephone Encounter (Signed)
 PT canceled colonoscopy for today at 4pm due to taking ibuprofen for menstrual cramps.

## 2023-05-23 NOTE — Telephone Encounter (Signed)
 Left voicemail for patient to let her know that Dr. Rhea Belton is OK to proceed with the procedure if she completed the prep or to let us know if she wants to reschedule.

## 2023-06-01 ENCOUNTER — Other Ambulatory Visit (HOSPITAL_COMMUNITY): Payer: Self-pay

## 2023-07-01 DIAGNOSIS — Z20822 Contact with and (suspected) exposure to covid-19: Secondary | ICD-10-CM | POA: Diagnosis not present

## 2023-07-01 DIAGNOSIS — R509 Fever, unspecified: Secondary | ICD-10-CM | POA: Diagnosis not present

## 2023-07-01 DIAGNOSIS — R6889 Other general symptoms and signs: Secondary | ICD-10-CM | POA: Diagnosis not present

## 2023-07-01 DIAGNOSIS — M791 Myalgia, unspecified site: Secondary | ICD-10-CM | POA: Diagnosis not present

## 2023-07-01 DIAGNOSIS — R059 Cough, unspecified: Secondary | ICD-10-CM | POA: Diagnosis not present

## 2023-07-19 ENCOUNTER — Ambulatory Visit: Payer: 59

## 2023-07-23 ENCOUNTER — Ambulatory Visit (INDEPENDENT_AMBULATORY_CARE_PROVIDER_SITE_OTHER)

## 2023-07-23 ENCOUNTER — Encounter: Payer: Self-pay | Admitting: Nurse Practitioner

## 2023-07-23 ENCOUNTER — Ambulatory Visit (INDEPENDENT_AMBULATORY_CARE_PROVIDER_SITE_OTHER): Admitting: Nurse Practitioner

## 2023-07-23 ENCOUNTER — Other Ambulatory Visit: Payer: Self-pay | Admitting: Nurse Practitioner

## 2023-07-23 ENCOUNTER — Other Ambulatory Visit: Payer: Self-pay | Admitting: Family Medicine

## 2023-07-23 VITALS — BP 102/64 | HR 73 | Temp 97.6°F | Ht 66.0 in | Wt 235.0 lb

## 2023-07-23 DIAGNOSIS — R0602 Shortness of breath: Secondary | ICD-10-CM

## 2023-07-23 DIAGNOSIS — R6889 Other general symptoms and signs: Secondary | ICD-10-CM | POA: Diagnosis not present

## 2023-07-23 DIAGNOSIS — R059 Cough, unspecified: Secondary | ICD-10-CM | POA: Diagnosis not present

## 2023-07-23 MED ORDER — METHYLPREDNISOLONE ACETATE 80 MG/ML IJ SUSP
80.0000 mg | Freq: Once | INTRAMUSCULAR | Status: AC
  Administered 2023-07-23: 80 mg via INTRAMUSCULAR

## 2023-07-23 MED ORDER — AZITHROMYCIN 250 MG PO TABS: ORAL_TABLET | ORAL | 0 refills | Status: DC

## 2023-07-23 NOTE — Patient Instructions (Signed)

## 2023-07-23 NOTE — Progress Notes (Signed)
 Subjective:    Patient ID: Molly Henson, female    DOB: 04-19-1981, 43 y.o.   MRN: [id number]   Chief Complaint: SOB  HPI  Patient comes in having had flu A 2 weeks ago. She is still coughing and has SOB. Voice is very raspy. Just not getting better. Patient Active Problem List   Diagnosis Date Noted   Rectal bleeding 12/27/2018   BMI 35.0-35.9,adult 09/10/2014   Preventative health care 09/10/2014   Other fatigue 09/10/2014       Review of Systems  Constitutional:  Negative for diaphoresis.  HENT:  Positive for voice change.   Eyes:  Negative for pain.  Respiratory:  Positive for cough and shortness of breath.   Cardiovascular:  Negative for chest pain, palpitations and leg swelling.  Gastrointestinal:  Negative for abdominal pain.  Endocrine: Negative for polydipsia.  Skin:  Negative for rash.  Neurological:  Negative for dizziness, weakness and headaches.  Hematological:  Does not bruise/bleed easily.  All other systems reviewed and are negative.      Objective:   Physical Exam Constitutional:      Appearance: Normal appearance.  HENT:     Mouth/Throat:     Comments: Raspy voice Cardiovascular:     Rate and Rhythm: Normal rate and regular rhythm.     Heart sounds: Normal heart sounds.  Pulmonary:     Effort: Pulmonary effort is normal.     Breath sounds: Normal breath sounds.  Skin:    General: Skin is warm.  Neurological:     General: No focal deficit present.     Mental Status: She is alert and oriented to person, place, and time.  Psychiatric:        Mood and Affect: Mood normal.        Behavior: Behavior normal.     BP 132/78   Pulse 78   Temp 97.6 F (36.4 C) (Skin)   Ht 5\' 9"  (1.753 m)   Wt 235 lb (106.6 kg)   SpO2 96%   BMI 34.70 kg/m    Chest xray- early infiltrate left lower lobe-Preliminary reading by Paulene Floor, FNP  [facility]       Assessment & Plan:   Molly Henson in today with chief complaint of No chief complaint on  file.   1. SOB (shortness of breath) (Primary) 1. Take meds as prescribed 2. Use a cool mist humidifier especially during the winter months and when heat has been humid. 3. Use saline nose sprays frequently 4. Saline irrigations of the nose can be very helpful if done frequently.  * 4X daily for 1 week*  * Use of a nettie pot can be helpful with this. Follow directions with this* 5. Drink plenty of fluids 6. Keep thermostat turn down low 7.For any cough or congestion- mucinex as needed 8. For fever or aces or pains- take tylenol or ibuprofen appropriate for age and weight.  * for fevers greater than 101 orally you may alternate ibuprofen and tylenol every  3 hours.   Meds ordered this encounter  Medications   methylPREDNISolone acetate (DEPO-MEDROL) injection 80 mg   Zpak sent to pharmacy    The above assessment and management plan was discussed with the patient. The patient verbalized understanding of and has agreed to the management plan. Patient is aware to call the clinic if symptoms persist or worsen. Patient is aware when to return to the clinic for a follow-up visit. Patient educated on when it  is appropriate to go to the emergency department.   Mary-Margaret Daphine Deutscher, FNP

## 2023-08-08 ENCOUNTER — Ambulatory Visit: Payer: Commercial Managed Care - PPO | Admitting: Dermatology

## 2023-08-27 ENCOUNTER — Encounter

## 2024-01-04 ENCOUNTER — Encounter: Payer: Self-pay | Admitting: Family

## 2024-01-04 ENCOUNTER — Ambulatory Visit (INDEPENDENT_AMBULATORY_CARE_PROVIDER_SITE_OTHER): Admitting: Family

## 2024-01-04 VITALS — BP 109/66 | HR 75 | Temp 97.8°F | Ht 66.0 in | Wt 240.0 lb

## 2024-01-04 DIAGNOSIS — R5383 Other fatigue: Secondary | ICD-10-CM | POA: Diagnosis not present

## 2024-01-04 DIAGNOSIS — E6609 Other obesity due to excess calories: Secondary | ICD-10-CM | POA: Diagnosis not present

## 2024-01-04 DIAGNOSIS — Z6838 Body mass index (BMI) 38.0-38.9, adult: Secondary | ICD-10-CM | POA: Diagnosis not present

## 2024-01-04 DIAGNOSIS — E66812 Obesity, class 2: Secondary | ICD-10-CM | POA: Insufficient documentation

## 2024-01-04 DIAGNOSIS — Z0000 Encounter for general adult medical examination without abnormal findings: Secondary | ICD-10-CM

## 2024-01-04 DIAGNOSIS — E559 Vitamin D deficiency, unspecified: Secondary | ICD-10-CM | POA: Diagnosis not present

## 2024-01-04 DIAGNOSIS — Z1283 Encounter for screening for malignant neoplasm of skin: Secondary | ICD-10-CM

## 2024-01-04 DIAGNOSIS — Z1211 Encounter for screening for malignant neoplasm of colon: Secondary | ICD-10-CM | POA: Diagnosis not present

## 2024-01-04 LAB — LIPID PANEL

## 2024-01-04 NOTE — Patient Instructions (Signed)
 Fatigue If you have fatigue, you feel tired all the time and have a lack of energy or a lack of motivation. Fatigue may make it difficult to start or complete tasks because of exhaustion. Occasional or mild fatigue is often a normal response to activity or life. However, long-term (chronic) or extreme fatigue may be a symptom of a medical condition such as: Depression. Not having enough red blood cells or hemoglobin in the blood (anemia). A problem with a small gland located in the lower front part of the neck (thyroid disorder). Rheumatologic conditions. These are problems related to the body's defense system (immune system). Infections, especially certain viral infections. Fatigue can also lead to negative health outcomes over time. Follow these instructions at home: Medicines Take over-the-counter and prescription medicines only as told by your health care provider. Take a multivitamin if told by your health care provider. Do not use herbal or dietary supplements unless they are approved by your health care provider. Eating and drinking  Avoid heavy meals in the evening. Eat a well-balanced diet, which includes lean proteins, whole grains, plenty of fruits and vegetables, and low-fat dairy products. Avoid eating or drinking too many products with caffeine in them. Avoid alcohol. Drink enough fluid to keep your urine pale yellow. Activity  Exercise regularly, as told by your health care provider. Use or practice techniques to help you relax, such as yoga, tai chi, meditation, or massage therapy. Lifestyle Change situations that cause you stress. Try to keep your work and personal schedules in balance. Do not use recreational or illegal drugs. General instructions Monitor your fatigue for any changes. Go to bed and get up at the same time every day. Avoid fatigue by pacing yourself during the day and getting enough sleep at night. Maintain a healthy weight. Contact a health care  provider if: Your fatigue does not get better. You have a fever. You suddenly lose or gain weight. You have headaches. You have trouble falling asleep or sleeping through the night. You feel angry, guilty, anxious, or sad. You have swelling in your legs or another part of your body. Get help right away if: You feel confused, feel like you might faint, or faint. Your vision is blurry or you have a severe headache. You have severe pain in your abdomen, your back, or the area between your waist and hips (pelvis). You have chest pain, shortness of breath, or an irregular or fast heartbeat. You are unable to urinate, or you urinate less than normal. You have abnormal bleeding from the rectum, nose, lungs, nipples, or, if you are female, the vagina. You vomit blood. You have thoughts about hurting yourself or others. These symptoms may be an emergency. Get help right away. Call 911. Do not wait to see if the symptoms will go away. Do not drive yourself to the hospital. Get help right away if you feel like you may hurt yourself or others, or have thoughts about taking your own life. Go to your nearest emergency room or: Call 911. Call the National Suicide Prevention Lifeline at (262)721-8699 or 988. This is open 24 hours a day. Text the Crisis Text Line at 8450584327. Summary If you have fatigue, you feel tired all the time and have a lack of energy or a lack of motivation. Fatigue may make it difficult to start or complete tasks because of exhaustion. Long-term (chronic) or extreme fatigue may be a symptom of a medical condition. Exercise regularly, as told by your health care provider.  Change situations that cause you stress. Try to keep your work and personal schedules in balance. This information is not intended to replace advice given to you by your health care provider. Make sure you discuss any questions you have with your health care provider. Document Revised: 02/21/2021 Document  Reviewed: 02/21/2021 Elsevier Patient Education  2024 [employer].

## 2024-01-04 NOTE — Progress Notes (Signed)
 Subjective:    Patient ID: Molly Henson, female    DOB: 09/20/1980, 43 y.o.   MRN: [id number]  Chief Complaint  Patient presents with   Medical Management of Chronic Issues    HPI PT presents to the office today for CPE without pap. She is followed by GYN annually. She is requesting a referral to dermatologists. She has several freckles and fair skin and wants her skin checked. Pt denies any headache, palpitations, SOB, or edema at this time.    She is complaining of increase fatigue and tiredness. She has had increased stress at work.   Review of Systems  All other systems reviewed and are negative.  Family History  Problem Relation Age of Onset   Hyperlipidemia Mother    Thyroid  disease Mother        Goiter   Atrial fibrillation Father    Hyperlipidemia Sister    Alopecia Sister    Thyroid  cancer Maternal Grandmother    Heart disease Maternal Grandfather        premature   Liver disease Paternal Grandmother    Aneurysm Paternal Grandfather        brain   Migraines Daughter    Breast cancer Neg Hx    Colon cancer Neg Hx    Esophageal cancer Neg Hx    Rectal cancer Neg Hx    Stomach cancer Neg Hx    Colon polyps Neg Hx    Social History   Socioeconomic History   Marital status: Married    Spouse name: Not on file   Number of children: 2   Years of education: Not on file   Highest education level: Not on file  Occupational History   Occupation: LPN    Employer: [employer]  Tobacco Use   Smoking status: Never   Smokeless tobacco: Never  Vaping Use   Vaping status: Never Used  Substance and Sexual Activity   Alcohol use: No    Alcohol/week: 0.0 standard drinks of alcohol   Drug use: No   Sexual activity: Yes    Birth control/protection: Surgical  Other Topics Concern   Not on file  Social History Narrative   Ms Marut is divorced & remarried. She lives with her husband, 2 biological children & step-son.   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   Social Drivers of Corporate investment banker Strain: Not on file  Food Insecurity: Not on file  Transportation Needs: Not on file  Physical Activity: Not on file  Stress: Not on file  Social Connections: Not on file       Objective:   Physical Exam Vitals reviewed.  Constitutional:      General: She is not in acute distress.    Appearance: She is well-developed. She is obese.  HENT:     Head: Normocephalic and atraumatic.     Right Ear: Tympanic membrane normal.     Left Ear: Tympanic membrane normal.  Eyes:     Pupils: Pupils are equal, round, and reactive to light.  Neck:     Thyroid : No thyromegaly.  Cardiovascular:     Rate and Rhythm: Normal rate and regular rhythm.     Heart sounds: Normal heart sounds. No murmur heard. Pulmonary:     Effort: Pulmonary effort is normal. No respiratory distress.     Breath sounds: Normal breath sounds. No wheezing.  Abdominal:     General: Bowel sounds are normal. There is  no distension.     Palpations: Abdomen is soft.     Tenderness: There is no abdominal tenderness.  Musculoskeletal:        General: No tenderness. Normal range of motion.     Cervical back: Normal range of motion and neck supple.  Skin:    General: Skin is warm and dry.  Neurological:     Mental Status: She is alert and oriented to person, place, and time.     Cranial Nerves: No cranial nerve deficit.     Deep Tendon Reflexes: Reflexes are normal and symmetric.  Psychiatric:        Behavior: Behavior normal.        Thought Content: Thought content normal.        Judgment: Judgment normal.      BP 109/66   Pulse 75   Temp 97.8 F (36.6 C) (Temporal)   Ht 5' 6 (1.676 m)   Wt 240 lb (108.9 kg)   BMI 38.74 kg/m        Assessment & Plan:  Molly Henson comes in today with chief complaint of Medical Management of Chronic Issues   Diagnosis and orders addressed:  1. Annual physical exam (Primary) - Ambulatory referral to  Gastroenterology - CMP14+EGFR - Lipid panel - TSH - Anemia Profile B - VITAMIN D  25 Hydroxy (Vit-D Deficiency, Fractures)  2. Class 2 obesity due to excess calories without serious comorbidity with body mass index (BMI) of 38.0 to 38.9 in adult - CMP14+EGFR  3. Other fatigue - CMP14+EGFR - TSH - Anemia Profile B - VITAMIN D  25 Hydroxy (Vit-D Deficiency, Fractures)  4. Colon cancer screening - Ambulatory referral to Gastroenterology - CMP14+EGFR  5. Vitamin D  deficiency - VITAMIN D  25 Hydroxy (Vit-D Deficiency, Fractures)  6. Skin cancer screening - Ambulatory referral to Dermatology   Labs pending  Referral to dermatologists and GI   Continue current medications  Health Maintenance reviewed Diet and exercise encouraged  Follow up plan: 1 year   Bari Learn, FNP

## 2024-01-05 LAB — ANEMIA PROFILE B
Basophils Absolute: 0 x10E3/uL (ref 0.0–0.2)
Basos: 1 %
EOS (ABSOLUTE): 0.2 x10E3/uL (ref 0.0–0.4)
Eos: 2 %
Ferritin: 162 ng/mL — ABNORMAL HIGH (ref 15–150)
Folate: 5.6 ng/mL (ref >3.0)
Hematocrit: 42.3 % (ref 34.0–46.6)
Hemoglobin: 14.1 g/dL (ref 11.1–15.9)
Immature Grans (Abs): 0 x10E3/uL (ref 0.0–0.1)
Immature Granulocytes: 0 %
Iron Saturation: 33 % (ref 15–55)
Iron: 94 ug/dL (ref 27–159)
Lymphocytes Absolute: 2.4 x10E3/uL (ref 0.7–3.1)
Lymphs: 31 %
MCH: 30.5 pg (ref 26.6–33.0)
MCHC: 33.3 g/dL (ref 31.5–35.7)
MCV: 92 fL (ref 79–97)
Monocytes Absolute: 0.8 x10E3/uL (ref 0.1–0.9)
Monocytes: 10 %
Neutrophils Absolute: 4.4 x10E3/uL (ref 1.4–7.0)
Neutrophils: 56 %
Platelets: 270 x10E3/uL (ref 150–450)
RBC: 4.62 x10E6/uL (ref 3.77–5.28)
RDW: 11.9 % (ref 11.7–15.4)
Retic Ct Pct: 1.6 % (ref 0.6–2.6)
Total Iron Binding Capacity: 289 ug/dL (ref 250–450)
UIBC: 195 ug/dL (ref 131–425)
Vitamin B-12: 410 pg/mL (ref 232–1245)
WBC: 7.8 x10E3/uL (ref 3.4–10.8)

## 2024-01-05 LAB — CMP14+EGFR
ALT: 17 IU/L (ref 0–32)
AST: 25 IU/L (ref 0–40)
Albumin: 4.1 g/dL (ref 3.9–4.9)
Alkaline Phosphatase: 72 IU/L (ref 44–121)
BUN/Creatinine Ratio: 11 (ref 9–23)
BUN: 8 mg/dL (ref 6–24)
Bilirubin Total: 0.7 mg/dL (ref 0.0–1.2)
CO2: 22 mmol/L (ref 20–29)
Calcium: 9.6 mg/dL (ref 8.7–10.2)
Chloride: 102 mmol/L (ref 96–106)
Creatinine, Ser: 0.72 mg/dL (ref 0.57–1.00)
Globulin, Total: 2.7 g/dL (ref 1.5–4.5)
Glucose: 93 mg/dL (ref 70–99)
Potassium: 4.3 mmol/L (ref 3.5–5.2)
Sodium: 137 mmol/L (ref 134–144)
Total Protein: 6.8 g/dL (ref 6.0–8.5)
eGFR: 105 mL/min/1.73 (ref >59)

## 2024-01-05 LAB — LIPID PANEL
Chol/HDL Ratio: 3.6 ratio (ref 0.0–4.4)
Cholesterol, Total: 160 mg/dL (ref 100–199)
HDL: 44 mg/dL (ref >39)
LDL Chol Calc (NIH): 86 mg/dL (ref 0–99)
Triglycerides: 177 mg/dL — ABNORMAL HIGH (ref 0–149)
VLDL Cholesterol Cal: 30 mg/dL (ref 5–40)

## 2024-01-05 LAB — VITAMIN D 25 HYDROXY (VIT D DEFICIENCY, FRACTURES): Vit D, 25-Hydroxy: 24.1 ng/mL — ABNORMAL LOW (ref 30.0–100.0)

## 2024-01-05 LAB — TSH: TSH: 1.42 u[IU]/mL (ref 0.450–4.500)

## 2024-01-07 ENCOUNTER — Other Ambulatory Visit: Payer: Self-pay

## 2024-01-07 ENCOUNTER — Other Ambulatory Visit: Payer: Self-pay | Admitting: Family

## 2024-01-07 ENCOUNTER — Other Ambulatory Visit (HOSPITAL_COMMUNITY): Payer: Self-pay

## 2024-01-07 ENCOUNTER — Ambulatory Visit: Payer: Self-pay | Admitting: Family

## 2024-01-07 DIAGNOSIS — E559 Vitamin D deficiency, unspecified: Secondary | ICD-10-CM

## 2024-01-07 MED ORDER — VITAMIN D (ERGOCALCIFEROL) 1.25 MG (50000 UNIT) PO CAPS
50000.0000 [IU] | ORAL_CAPSULE | ORAL | 3 refills | Status: AC
  Filled 2024-01-07 – 2024-01-23 (×2): qty 12, 84d supply, fill #0
  Filled 2024-04-20: qty 12, 84d supply, fill #1

## 2024-01-08 ENCOUNTER — Other Ambulatory Visit: Payer: Self-pay

## 2024-01-11 ENCOUNTER — Other Ambulatory Visit: Payer: Self-pay

## 2024-01-23 ENCOUNTER — Other Ambulatory Visit (HOSPITAL_COMMUNITY): Payer: Self-pay

## 2024-01-23 ENCOUNTER — Other Ambulatory Visit: Payer: Self-pay

## 2024-02-05 ENCOUNTER — Encounter: Payer: Self-pay | Admitting: Internal Medicine

## 2024-04-03 ENCOUNTER — Encounter: Payer: Self-pay | Admitting: Internal Medicine

## 2024-04-20 ENCOUNTER — Other Ambulatory Visit (HOSPITAL_COMMUNITY): Payer: Self-pay

## 2024-09-17 ENCOUNTER — Ambulatory Visit: Admitting: Dermatology
